# Patient Record
Sex: Female | Born: 1960
Health system: Southern US, Community
[De-identification: ages and names within clinical notes are randomized; demographics above are authoritative.]

## PROBLEM LIST (undated history)

## (undated) DIAGNOSIS — K219 Gastro-esophageal reflux disease without esophagitis: Secondary | ICD-10-CM

## (undated) DIAGNOSIS — M069 Rheumatoid arthritis, unspecified: Secondary | ICD-10-CM

## (undated) DIAGNOSIS — E079 Disorder of thyroid, unspecified: Secondary | ICD-10-CM

## (undated) HISTORY — DX: Gastro-esophageal reflux disease without esophagitis: K21.9

## (undated) HISTORY — DX: Disorder of thyroid, unspecified: E07.9

---

## 1999-05-06 ENCOUNTER — Other Ambulatory Visit: Admission: RE | Admit: 1999-05-06 | Discharge: 1999-05-06 | Payer: Self-pay | Admitting: Internal Medicine

## 2001-04-29 ENCOUNTER — Other Ambulatory Visit: Admission: RE | Admit: 2001-04-29 | Discharge: 2001-04-29 | Payer: Self-pay | Admitting: Internal Medicine

## 2004-09-18 ENCOUNTER — Ambulatory Visit: Payer: Self-pay | Admitting: Internal Medicine

## 2004-12-30 ENCOUNTER — Ambulatory Visit: Payer: Self-pay | Admitting: Internal Medicine

## 2005-04-03 ENCOUNTER — Emergency Department (HOSPITAL_COMMUNITY): Admission: EM | Admit: 2005-04-03 | Discharge: 2005-04-04 | Payer: Self-pay | Admitting: Emergency Medicine

## 2005-10-12 ENCOUNTER — Ambulatory Visit: Payer: Self-pay | Admitting: Internal Medicine

## 2006-05-13 ENCOUNTER — Ambulatory Visit: Payer: Self-pay | Admitting: Internal Medicine

## 2007-01-20 ENCOUNTER — Ambulatory Visit: Payer: Self-pay | Admitting: Internal Medicine

## 2007-01-20 DIAGNOSIS — E039 Hypothyroidism, unspecified: Secondary | ICD-10-CM | POA: Insufficient documentation

## 2007-01-20 DIAGNOSIS — K219 Gastro-esophageal reflux disease without esophagitis: Secondary | ICD-10-CM | POA: Insufficient documentation

## 2007-01-20 LAB — CONVERTED CEMR LAB: TSH: 3.71 microintl units/mL (ref 0.35–5.50)

## 2007-01-28 ENCOUNTER — Telehealth: Payer: Self-pay | Admitting: Internal Medicine

## 2008-01-12 ENCOUNTER — Ambulatory Visit: Payer: Self-pay | Admitting: Internal Medicine

## 2008-01-12 LAB — CONVERTED CEMR LAB: TSH: 3.58 microintl units/mL (ref 0.35–5.50)

## 2009-01-22 ENCOUNTER — Ambulatory Visit: Payer: Self-pay | Admitting: Internal Medicine

## 2009-01-22 DIAGNOSIS — R5381 Other malaise: Secondary | ICD-10-CM | POA: Insufficient documentation

## 2009-01-22 DIAGNOSIS — R5383 Other fatigue: Secondary | ICD-10-CM | POA: Insufficient documentation

## 2009-01-22 LAB — CONVERTED CEMR LAB
Albumin: 3.9 g/dL (ref 3.5–5.2)
BUN: 11 mg/dL (ref 6–23)
Basophils Absolute: 0 10*3/uL (ref 0.0–0.1)
CO2: 29 meq/L (ref 19–32)
Chloride: 106 meq/L (ref 96–112)
Eosinophils Absolute: 0.1 10*3/uL (ref 0.0–0.7)
Glucose, Bld: 61 mg/dL — ABNORMAL LOW (ref 70–99)
HCT: 39.9 % (ref 36.0–46.0)
Lymphocytes Relative: 34.4 % (ref 12.0–46.0)
MCHC: 34.1 g/dL (ref 30.0–36.0)
Platelets: 176 10*3/uL (ref 150.0–400.0)
Potassium: 4.2 meq/L (ref 3.5–5.1)
RDW: 12.4 % (ref 11.5–14.6)
Total Bilirubin: 1 mg/dL (ref 0.3–1.2)
Total Protein: 7.2 g/dL (ref 6.0–8.3)
WBC: 5.2 10*3/uL (ref 4.5–10.5)

## 2012-06-17 ENCOUNTER — Encounter: Payer: Self-pay | Admitting: Internal Medicine

## 2012-06-17 ENCOUNTER — Ambulatory Visit (INDEPENDENT_AMBULATORY_CARE_PROVIDER_SITE_OTHER): Payer: Self-pay | Admitting: Internal Medicine

## 2012-06-17 VITALS — BP 120/80 | HR 65 | Temp 97.9°F | Resp 18 | Ht 61.5 in | Wt 195.0 lb

## 2012-06-17 DIAGNOSIS — E039 Hypothyroidism, unspecified: Secondary | ICD-10-CM

## 2012-06-17 MED ORDER — LEVOTHYROXINE SODIUM 75 MCG PO TABS: 75.0000 ug | ORAL_TABLET | Freq: Every day | ORAL | Status: DC

## 2012-06-17 NOTE — Patient Instructions (Signed)
It is important that you exercise regularly, at least 20 minutes 3 to 4 times per week.  If you develop chest pain or shortness of breath seek  medical attention.   

## 2012-06-17 NOTE — Progress Notes (Signed)
  Subjective:    Patient ID: Julie Perry, female    DOB: [birth year], 52 y.o.   MRN: [id number]  HPI   52 year old patient who is seen today to reestablish with our practice. She has a history of hypothyroidism and has recently relocated back to the [place] area.  She had been no residing on the [facility] medical regimen includes Synthroid 75 mcg only. This has been a stable dose for some time  Past medical history is fairly unremarkable she has had remote tonsillectomy 1967. No other chronic medications.  Family history details of  father's health unknown Mother age 69 history is pertinent pacemaker chronic anticoagulation diabetes 4 sisters one with hypothyroidism.  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  HENT: Negative for hearing loss, ear pain, nosebleeds, congestion, sore throat, mouth sores, trouble swallowing, neck stiffness, dental problem, voice change, sinus pressure and tinnitus.   Eyes: Negative for photophobia, pain, redness and visual disturbance.  Respiratory: Negative for cough, chest tightness and shortness of breath.   Cardiovascular: Negative for chest pain, palpitations and leg swelling.  Gastrointestinal: Negative for nausea, vomiting, abdominal pain, diarrhea, constipation, blood in stool, abdominal distention and rectal pain.  Genitourinary: Negative for dysuria, urgency, frequency, hematuria, flank pain, vaginal bleeding, vaginal discharge, difficulty urinating, genital sores, vaginal pain, menstrual  problem and pelvic pain.  Musculoskeletal: Negative for back pain and arthralgias.  Skin: Negative for rash.  Neurological: Negative for dizziness, syncope, speech difficulty, weakness, light-headedness, numbness and headaches.  Hematological: Negative for adenopathy. Does not bruise/bleed easily.  Psychiatric/Behavioral: Negative for suicidal ideas, behavioral problems, self-injury, dysphoric mood and agitation. The patient is not nervous/anxious.        Objective:   Physical Exam  Constitutional: She is oriented to person, place, and time. She appears well-developed and well-nourished.  HENT:  Head: Normocephalic and atraumatic.  Right Ear: External ear normal.  Left Ear: External ear normal.  Mouth/Throat: Oropharynx is clear and moist.  Eyes: Conjunctivae and EOM are normal.  Neck: Normal range of motion. Neck supple. No JVD present. No thyromegaly present.  Cardiovascular: Normal rate, regular rhythm, normal heart sounds and intact distal pulses.   No murmur heard. Pulmonary/Chest: Effort normal and breath sounds normal. She has no wheezes. She has no rales.  Abdominal: Soft. Bowel sounds are normal. She exhibits no distension and no mass. There is no tenderness. There is no rebound and no guarding.  Musculoskeletal: Normal range of motion. She exhibits no edema and no tenderness.  Neurological: She is alert and oriented to person, place, and time. She has normal reflexes. No cranial nerve deficit. She exhibits normal muscle tone. Coordination normal.  Skin: Skin is warm and dry. No rash noted.  Psychiatric: She has a normal mood and affect. Her behavior is normal.          Assessment & Plan:   Hypothyroidism. Medications refilled. Presently patient has no health insurance we'll check a TSH once this becomes available. She has been on a stable dose for quite some time and clinically is euthyroid

## 2013-06-20 ENCOUNTER — Ambulatory Visit: Payer: Self-pay | Admitting: Internal Medicine

## 2013-06-22 ENCOUNTER — Ambulatory Visit (INDEPENDENT_AMBULATORY_CARE_PROVIDER_SITE_OTHER): Payer: 59 | Admitting: Internal Medicine

## 2013-06-22 ENCOUNTER — Ambulatory Visit: Payer: Self-pay | Admitting: Internal Medicine

## 2013-06-22 ENCOUNTER — Telehealth: Payer: Self-pay | Admitting: Internal Medicine

## 2013-06-22 ENCOUNTER — Encounter: Payer: Self-pay | Admitting: Internal Medicine

## 2013-06-22 VITALS — BP 110/76 | HR 74 | Temp 98.4°F | Resp 20 | Ht 61.5 in | Wt 196.0 lb

## 2013-06-22 DIAGNOSIS — E6609 Other obesity due to excess calories: Secondary | ICD-10-CM

## 2013-06-22 DIAGNOSIS — E039 Hypothyroidism, unspecified: Secondary | ICD-10-CM

## 2013-06-22 DIAGNOSIS — M25559 Pain in unspecified hip: Secondary | ICD-10-CM

## 2013-06-22 DIAGNOSIS — M25569 Pain in unspecified knee: Secondary | ICD-10-CM

## 2013-06-22 DIAGNOSIS — K219 Gastro-esophageal reflux disease without esophagitis: Secondary | ICD-10-CM

## 2013-06-22 DIAGNOSIS — E669 Obesity, unspecified: Secondary | ICD-10-CM | POA: Insufficient documentation

## 2013-06-22 DIAGNOSIS — Z6838 Body mass index (BMI) 38.0-38.9, adult: Secondary | ICD-10-CM | POA: Insufficient documentation

## 2013-06-22 DIAGNOSIS — Z0000 Encounter for general adult medical examination without abnormal findings: Secondary | ICD-10-CM

## 2013-06-22 DIAGNOSIS — IMO0001 Reserved for inherently not codable concepts without codable children: Secondary | ICD-10-CM

## 2013-06-22 LAB — COMPREHENSIVE METABOLIC PANEL
ALK PHOS: 77 U/L (ref 39–117)
ALT: 20 U/L (ref 0–35)
AST: 18 U/L (ref 0–37)
Albumin: 4 g/dL (ref 3.5–5.2)
BILIRUBIN TOTAL: 0.4 mg/dL (ref 0.3–1.2)
BUN: 14 mg/dL (ref 6–23)
CHLORIDE: 103 meq/L (ref 96–112)
CO2: 29 meq/L (ref 19–32)
Calcium: 8.8 mg/dL (ref 8.4–10.5)
Creatinine, Ser: 0.7 mg/dL (ref 0.4–1.2)
GFR: 88.03 mL/min (ref >60.00)
GLUCOSE: 83 mg/dL (ref 70–99)
Potassium: 3.6 mEq/L (ref 3.5–5.1)
Sodium: 137 mEq/L (ref 135–145)
Total Protein: 7.4 g/dL (ref 6.0–8.3)

## 2013-06-22 LAB — CBC WITH DIFFERENTIAL/PLATELET
BASOS ABS: 0 10*3/uL (ref 0.0–0.1)
Basophils Relative: 0.6 % (ref 0.0–3.0)
Eosinophils Absolute: 0.3 10*3/uL (ref 0.0–0.7)
Eosinophils Relative: 3.6 % (ref 0.0–5.0)
HEMATOCRIT: 36.8 % (ref 36.0–46.0)
HEMOGLOBIN: 12.1 g/dL (ref 12.0–15.0)
Lymphocytes Relative: 32.9 % (ref 12.0–46.0)
Lymphs Abs: 2.5 10*3/uL (ref 0.7–4.0)
MCHC: 33 g/dL (ref 30.0–36.0)
MCV: 89.5 fl (ref 78.0–100.0)
Monocytes Absolute: 0.4 10*3/uL (ref 0.1–1.0)
Monocytes Relative: 5.6 % (ref 3.0–12.0)
Neutro Abs: 4.3 10*3/uL (ref 1.4–7.7)
Neutrophils Relative %: 57.3 % (ref 43.0–77.0)
PLATELETS: 203 10*3/uL (ref 150.0–400.0)
RBC: 4.11 Mil/uL (ref 3.87–5.11)
RDW: 13.6 % (ref 11.5–14.6)
WBC: 7.6 10*3/uL (ref 4.5–10.5)

## 2013-06-22 LAB — LIPID PANEL
Cholesterol: 176 mg/dL (ref 0–200)
HDL: 55.8 mg/dL (ref >39.00)
LDL Cholesterol: 96 mg/dL (ref 0–99)
Total CHOL/HDL Ratio: 3
Triglycerides: 122 mg/dL (ref 0.0–149.0)
VLDL: 24.4 mg/dL (ref 0.0–40.0)

## 2013-06-22 LAB — TSH: TSH: 1.24 u[IU]/mL (ref 0.35–5.50)

## 2013-06-22 LAB — SEDIMENTATION RATE: SED RATE: 29 mm/h — AB (ref 0–22)

## 2013-06-22 NOTE — Progress Notes (Signed)
Subjective:    Patient ID: Julie Perry, female    DOB: 21-Feb-1961, 53 y.o.   MRN: [id number]  HPI  53 year old patient who is seen today for an annual exam.  She has a long history of treated hypothyroidism. Complaints today include right hip pain of greater than one year in duration.  She does work at [employer] and spends much of her day on concrete hard surfaces.  She also describes some low back pain.  For the past several months.  She also describes a arthralgias involving the small joints of the hands, elbows, shoulders.  She wakes up in the morning feeling well and denies any early morning pain or stiffness.  Symptoms seemed to worsen throughout the day.  There has been some weight gain.   Past medical history is fairly unremarkable she has had remote tonsillectomy 1967. No other chronic medications.   Family history details of father's health unknown  Mother age 55 history is pertinent pacemaker chronic anticoagulation diabetes  4 sisters one with hypothyroidism.   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  HENT: Negative for congestion, dental problem, hearing loss, rhinorrhea, sinus pressure, sore throat and tinnitus.   Eyes: Negative for pain, discharge and visual disturbance.  Respiratory: Negative for cough and shortness of breath.   Cardiovascular: Negative for chest pain, palpitations and leg swelling.  Gastrointestinal: Negative for nausea, vomiting, abdominal pain, diarrhea, constipation, blood in stool and abdominal distention.    Genitourinary: Negative for dysuria, urgency, frequency, hematuria, flank pain, vaginal bleeding, vaginal discharge, difficulty urinating, vaginal pain and pelvic pain.  Musculoskeletal: Positive for arthralgias and back pain. Negative for gait problem and joint swelling.  Skin: Negative for rash.  Neurological: Negative for dizziness, syncope, speech difficulty, weakness, numbness and headaches.  Hematological: Negative for adenopathy.  Psychiatric/Behavioral: Negative for behavioral problems, dysphoric mood and agitation. The patient is not nervous/anxious.        Objective:   Physical Exam  Constitutional: She is oriented to person, place, and time. She appears well-developed and well-nourished.  HENT:  Head: Normocephalic and atraumatic.  Right Ear: External ear normal.  Left Ear: External ear normal.  Mouth/Throat: Oropharynx is clear and moist.  Eyes: Conjunctivae and EOM are normal.  Neck: Normal range of motion. Neck supple. No JVD present. No thyromegaly present.  Cardiovascular: Normal rate, regular rhythm, normal heart sounds and intact distal pulses.   No murmur heard. Pulmonary/Chest: Effort normal and breath sounds normal. She has no wheezes. She has no rales.  Abdominal: Soft. Bowel sounds are normal. She exhibits no distension and no mass. There is no tenderness. There is no rebound and no guarding.  Genitourinary: Vagina normal.  Musculoskeletal: Normal range of motion. She exhibits no edema and no tenderness.  Very early osteoarthritic changes involving the small joints of the hands, but no active synovitis Range of motion of the right hip appeared to be intact No tenderness over the greater trochanter  Neurological: She is alert and oriented to person, place, and time. She has normal reflexes. No cranial  nerve deficit. She exhibits normal muscle tone. Coordination normal.  Skin: Skin is warm and dry. No rash noted.  Psychiatric: She has a normal mood and affect. Her  behavior is normal.          Assessment & Plan:   Preventive health examination.  The patient would like to see a female physician for her pelvic and Pap.  Will set up with another provider.  The patient was encouraged to transfer her care to Dr. Lessie Dings check screening lab including TSH.  We'll set up for a screening colonoscopy.  Mammogram encouraged Right hip pain.  Probable bursitis.  Will try anti-inflammatory trial.  We'll check a sedimentation rate Arthralgias.  No early morning stiffness.  Doubt inflammatory arthritis Low back pain.  We'll try anti-inflammatories, weight loss encouraged

## 2013-06-22 NOTE — Progress Notes (Signed)
Pre-visit discussion using our clinic review tool. No additional management support is needed unless otherwise documented below in the visit note.  

## 2013-06-22 NOTE — Progress Notes (Deleted)
  Subjective:    Patient ID: Julie Perry, female    DOB: [birth year], 53 y.o.   MRN: [id number]  HPI    53 year old patient who is seen today to reestablish with our practice. She has a history of hypothyroidism and has recently relocated back to the [place] area.  [facility] includes Synthroid 75 mcg only. This has been a stable dose for some time  Past medical history is fairly unremarkable she has had remote tonsillectomy 1967. No other chronic medications.  Family history details of  father's health unknown Mother age 55 history is pertinent pacemaker chronic anticoagulation diabetes 4 sisters one with hypothyroidism.  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  HENT: Negative for congestion, dental problem, ear pain, hearing loss, mouth sores, nosebleeds, sinus pressure, sore throat, tinnitus, trouble swallowing and voice change.   Eyes: Negative for photophobia, pain, redness and visual disturbance.  Respiratory: Negative for cough, chest tightness and shortness of breath.   Cardiovascular: Negative for chest pain, palpitations and leg swelling.  Gastrointestinal: Negative for nausea, vomiting, abdominal pain, diarrhea, constipation, blood in stool, abdominal distention and rectal pain.  Genitourinary: Negative for dysuria, urgency, frequency, hematuria, flank pain, vaginal bleeding, vaginal discharge, difficulty urinating, genital sores, vaginal pain, menstrual problem and  pelvic pain.  Musculoskeletal: Negative for arthralgias, back pain and neck stiffness.  Skin: Negative for rash.  Neurological: Negative for dizziness, syncope, speech difficulty, weakness, light-headedness, numbness and headaches.  Hematological: Negative for adenopathy. Does not bruise/bleed easily.  Psychiatric/Behavioral: Negative for suicidal ideas, behavioral problems, self-injury, dysphoric mood and agitation. The patient is not nervous/anxious.        Objective:   Physical Exam  Constitutional: She is oriented to person, place, and time. She appears well-developed and well-nourished.  HENT:  Head: Normocephalic and atraumatic.  Right Ear: External ear normal.  Left Ear: External ear normal.  Mouth/Throat: Oropharynx is clear and moist.  Eyes: Conjunctivae and EOM are normal.  Neck: Normal range of motion. Neck supple. No JVD present. No thyromegaly present.  Cardiovascular: Normal rate, regular rhythm, normal heart sounds and intact distal pulses.   No murmur heard. Pulmonary/Chest: Effort normal and breath sounds normal. She has no wheezes. She has no rales.  Abdominal: Soft. Bowel sounds are normal. She exhibits no distension and no mass. There is no tenderness. There is no rebound and no guarding.  Musculoskeletal: Normal range of motion. She exhibits no edema and no tenderness.  Neurological: She is alert and oriented to person, place, and time. She has normal reflexes. No cranial nerve deficit. She exhibits normal muscle tone. Coordination normal.  Skin: Skin is warm and dry. No rash noted.  Psychiatric: She has a normal mood and affect. Her behavior is normal.          Assessment & Plan:   Hypothyroidism. Medications refilled. Presently patient has no health insurance we'll check a TSH once this becomes available. She has been on a stable dose for quite some time and clinically is euthyroid

## 2013-06-22 NOTE — Patient Instructions (Signed)
It is important that you exercise regularly, at least 20 minutes 3 to 4 times per week.  If you develop chest pain or shortness of breath seek  medical attention.   Take Aleve 200 mg twice daily for pain or swelling  You need to lose weight.  Consider a lower calorie diet and regular exercise.  Schedule your colonoscopy to help detect colon cancer.  Schedule your mammogram.Back Injury Prevention Back injuries can be extremely painful and difficult to heal. After having one back injury, you are much more likely to experience another later on. It is important to learn how to avoid injuring or re-injuring your back. The following tips can help you to prevent a back injury. PHYSICAL FITNESS  Exercise regularly and try to develop good tone in your abdominal muscles. Your abdominal muscles provide a lot of the support needed by your back.  Do aerobic exercises (walking, jogging, biking, swimming) regularly.  Do exercises that increase balance and strength (tai chi, yoga) regularly. This can decrease your risk of falling and injuring your back.  Stretch before and after exercising.  Maintain a healthy weight. The more you weigh, the more stress is placed on your back. For every pound of weight, 10 times that amount of pressure is placed on the back. DIET  Talk to your caregiver about how much calcium and vitamin D you need per day. These nutrients help to prevent weakening of the bones (osteoporosis). Osteoporosis can cause broken (fractured) bones that lead to back pain.  Include good sources of calcium in your diet, such as dairy products, green, leafy vegetables, and products with calcium added (fortified).  Include good sources of vitamin D in your diet, such as milk and foods that are fortified with vitamin D.  Consider taking a nutritional supplement or a multivitamin if needed.  Stop smoking if you smoke. POSTURE  Sit and stand up straight. Avoid leaning forward when you sit or  hunching over when you stand.  Choose chairs with good low back (lumbar) support.  If you work at a desk, sit close to your work so you do not need to lean over. Keep your chin tucked in. Keep your neck drawn back and elbows bent at a right angle. Your arms should look like the letter "L."  Sit high and close to the steering wheel when you drive. Add a lumbar support to your car seat if needed.  Avoid sitting or standing in one position for too long. Take breaks to get up, stretch, and walk around at least once every hour. Take breaks if you are driving for long periods of time.  Sleep on your side with your knees slightly bent, or sleep on your back with a pillow under your knees. Do not sleep on your stomach. LIFTING, TWISTING, AND REACHING  Avoid heavy lifting, especially repetitive lifting. If you must do heavy lifting:  Stretch before lifting.  Work slowly.  Rest between lifts.  Use carts and dollies to move objects when possible.  Make several small trips instead of carrying 1 heavy load.  Ask for help when you need it.  Ask for help when moving big, awkward objects.  Follow these steps when lifting:  Stand with your feet shoulder-width apart.  Get as close to the object as you can. Do not try to pick up heavy objects that are far from your body.  Use handles or lifting straps if they are available.  Bend at your knees. Squat down, but keep  your heels off the floor.  Keep your shoulders pulled back, your chin tucked in, and your back straight.  Lift the object slowly, tightening the muscles in your legs, abdomen, and buttocks. Keep the object as close to the center of your body as possible.  When you put a load down, use these same guidelines in reverse.  Do not:  Lift the object above your waist.  Twist at the waist while lifting or carrying a load. Move your feet if you need to turn, not your waist.  Bend over without bending at your knees.  Avoid reaching  over your head, across a table, or for an object on a high surface. OTHER TIPS  Avoid wet floors and keep sidewalks clear of ice to prevent falls.  Do not sleep on a mattress that is too soft or too hard.  Keep items that are used frequently within easy reach.  Put heavier objects on shelves at waist level and lighter objects on lower or higher shelves.  Find ways to decrease your stress, such as exercise, massage, or relaxation techniques. Stress can build up in your muscles. Tense muscles are more vulnerable to injury.  Seek treatment for depression or anxiety if needed. These conditions can increase your risk of developing back pain. SEEK MEDICAL CARE IF:  You injure your back.  You have questions about diet, exercise, or other ways to prevent back injuries. MAKE SURE YOU:  Understand these instructions.  Will watch your condition.  Will get help right away if you are not doing well or get worse. Document Released: 04/23/2004 Document Revised: 06/08/2011 Document Reviewed: 04/27/2011 [facility] Patient Information 2014 [place], [place]. Chronic Back Pain  When back pain lasts longer than 3 months, it is called chronic back pain.People with chronic back pain often go through certain periods that are more intense (flare-ups).  CAUSES Chronic back pain can be caused by wear and tear (degeneration) on different structures in your back. These structures include:  The bones of your spine (vertebrae) and the joints surrounding your spinal cord and nerve roots (facets).  The strong, fibrous tissues that connect your vertebrae (ligaments). Degeneration of these structures may result in pressure on your nerves. This can lead to constant pain. HOME CARE INSTRUCTIONS  Avoid bending, heavy lifting, prolonged sitting, and activities which make the problem worse.  Take brief periods of rest throughout the day to reduce your pain. Lying down or standing usually is better than sitting while  you are resting.  Take over-the-counter or prescription medicines only as directed by your caregiver. SEEK IMMEDIATE MEDICAL CARE IF:   You have weakness or numbness in one of your legs or feet.  You have trouble controlling your bladder or bowels.  You have nausea, vomiting, abdominal pain, shortness of breath, or fainting. Document Released: 04/23/2004 Document Revised: 06/08/2011 Document Reviewed: 02/28/2011 [facility] Patient Information 2014 [place], [place].

## 2013-06-22 NOTE — Telephone Encounter (Signed)
Pt seen today and needs refill for levothyroxine (SYNTHROID, LEVOTHROID) 75 MCG tablet 90 day w/ refills (1 /dy) [facility] [address] Pt doesn't need today, has enough for 2 wks.

## 2013-06-24 MED ORDER — LEVOTHYROXINE SODIUM 75 MCG PO TABS: 75.0000 ug | ORAL_TABLET | Freq: Every day | ORAL | Status: DC

## 2013-06-24 NOTE — Telephone Encounter (Signed)
Rx sent 

## 2013-09-22 ENCOUNTER — Ambulatory Visit: Payer: 59 | Admitting: Family Medicine

## 2013-11-21 ENCOUNTER — Other Ambulatory Visit (HOSPITAL_COMMUNITY)
Admission: RE | Admit: 2013-11-21 | Discharge: 2013-11-21 | Disposition: A | Discharge status: Home / Self Care | Payer: 59 | Source: Ambulatory Visit | Attending: Family Medicine | Admitting: Family Medicine

## 2013-11-21 ENCOUNTER — Ambulatory Visit (INDEPENDENT_AMBULATORY_CARE_PROVIDER_SITE_OTHER): Payer: 59 | Admitting: Family Medicine

## 2013-11-21 ENCOUNTER — Encounter: Payer: Self-pay | Admitting: Family Medicine

## 2013-11-21 VITALS — BP 104/72 | HR 60 | Temp 97.9°F | Ht 62.0 in | Wt 202.0 lb

## 2013-11-21 DIAGNOSIS — Z01419 Encounter for gynecological examination (general) (routine) without abnormal findings: Secondary | ICD-10-CM | POA: Diagnosis not present

## 2013-11-21 DIAGNOSIS — Z1151 Encounter for screening for human papillomavirus (HPV): Secondary | ICD-10-CM | POA: Diagnosis present

## 2013-11-21 DIAGNOSIS — E039 Hypothyroidism, unspecified: Secondary | ICD-10-CM

## 2013-11-21 DIAGNOSIS — I872 Venous insufficiency (chronic) (peripheral): Secondary | ICD-10-CM

## 2013-11-21 DIAGNOSIS — Z0000 Encounter for general adult medical examination without abnormal findings: Secondary | ICD-10-CM

## 2013-11-21 DIAGNOSIS — M722 Plantar fascial fibromatosis: Secondary | ICD-10-CM

## 2013-11-21 LAB — TSH: TSH: 1.64 u[IU]/mL (ref 0.35–4.50)

## 2013-11-21 NOTE — Progress Notes (Signed)
Pre visit review using our clinic review tool, if applicable. No additional management support is needed unless otherwise documented below in the visit note. 

## 2013-11-21 NOTE — Addendum Note (Signed)
Addended by: Johnella Moloney on: 11/21/2013 09:20 AM   Modules accepted: Orders

## 2013-11-21 NOTE — Patient Instructions (Addendum)
-  schedule your mammogram  -Vitamin D3 1000 IU daily; Calcium  -complete the stool cards as soon as possible  -We have ordered labs or studies at this visit. It can take up to 1-2 weeks for results and processing. We will contact you with instructions IF your results are abnormal. Normal results will be released to your [facility]. If you have not heard from [place] or can not find your results in [facility] in 2 weeks please contact our office.  We recommend the following healthy lifestyle measures: - eat a healthy diet consisting of lots of vegetables, fruits, beans, nuts, seeds, healthy meats such as white chicken and fish and whole grains.  - avoid fried foods, fast food, processed foods, sodas, red meet and other fattening foods.  - get a least 150 minutes of aerobic exercise per week.   -compression stockings at work  -[place] sock for foot pain  -follow up in 1-2 months

## 2013-11-21 NOTE — Progress Notes (Signed)
No chief complaint on file.   HPI:  Here for CPE:  -Concerns and/or follow up today:   Note: Julie Perry is a pleasant 53 yo patient of Dr. Amador Cunas whom wanted a female doctor to do her physical - per notes she was to transfer care to me.   Hypothyroidism: -stable: last TSH 05/2013 -denies: heat/cold intolerance, palpitations, skin changes  Obesity: -Diet: variety of foods, balance and well rounded, larger portion sizes -Exercise: no regular exercise  L heel pain: -started about 1month ago -with first steps in morning -no injury or hx of this pain, no weakness or numness  Chronic LE swelling when on concrete all day  -Taking folic acid, vitamin D or calcium: no  -Diabetes and Dyslipidemia Screening: done in last 1 year  -Hx of HTN: no  -Vaccines: UTD  -pap history: thinks was several years ago, no abnormal pap smears per her report  -FDLMP: N/A  -sexual activity: yes,  no new partners - no sex in 3 years  -wants STI testing: declined  -FH breast, colon or ovarian ca: see FH Last mammogram: several years ago Last colon cancer screening: not done, she does not want to do the colonoscopy - agrees to do the stool cards  Breast Ca Risk Assessment: -[email or web address] 5 year risk 1.3  -Alcohol, Tobacco, drug use: see social history  Review of Systems - no fevers, unintentional weight loss, vision loss, hearing loss, chest pain, sob, hemoptysis, melena, hematochezia, hematuria, genital discharge, changing or concerning skin lesions, bleeding, bruising, loc, thoughts of self harm or SI  Past Medical History  Diagnosis Date  . Thyroid disease   . GERD (gastroesophageal reflux disease)     History reviewed. No pertinent past surgical history.  No family history on file.  History   Social History  . Marital Status: Married    Spouse Name: N/A    Number of Children: N/A  . Years of Education: N/A   Social History Main Topics  . Smoking  status: Former Smoker    Types: Cigarettes  . Smokeless tobacco: Never Used  . Alcohol Use: Yes     Comment: Rarely once a year  . Drug Use: No  . Sexual Activity: None   Other Topics Concern  . None   Social History Narrative   Work or School: Engineer, technical sales Situation: lives with son (18 yo)      Spiritual Beliefs: Christian      Lifestyle: no regular exercise, diet is so so             Current outpatient prescriptions:levothyroxine (SYNTHROID, LEVOTHROID) 75 MCG tablet, Take 1 tablet (75 mcg total) by mouth daily., Disp: 90 tablet, Rfl: 3  EXAM:  Filed Vitals:   11/21/13 0827  BP: 104/72  Pulse: 60  Temp: 97.9 F (36.6 C)    GENERAL: vitals reviewed and listed below, alert, oriented, appears well hydrated and in no acute distress  HEENT: head atraumatic, PERRLA, normal appearance of eyes, ears, nose and mouth. moist mucus membranes.  NECK: supple, no masses or lymphadenopathy  LUNGS: clear to auscultation bilaterally, no rales, rhonchi or wheeze  CV: HRRR, no peripheral edema or cyanosis, normal pedal pulses  BREAST: normal appearance - no lesions or discharge, on palpation normal breast tissue without any suspicious masses  ABDOMEN: bowel sounds normal, soft, non tender to palpation, no masses, no rebound or guarding  GU: normal appearance of external genitalia -  no lesions or masses, normal vaginal mucosa - no abnormal discharge, normal appearance of cervix - no lesions or abnormal discharge, no masses or tenderness on palpation of uterus and ovaries.  RECTAL: refused  SKIN: no rash or abnormal lesions  MS: normal gait, moves all extremities normally, TTP plantar fascial attach L foot, muscle strength and sensation in tact  NEURO: CN II-XII grossly intact, normal muscle strength and sensation to light touch on extremities  PSYCH: normal affect, pleasant and cooperative  ASSESSMENT AND PLAN:  Discussed the  following assessment and plan:  There are no diagnoses linked to this encounter.  -Discussed and advised all [place] preventive services health task force level A and B recommendations for age, sex and risks.  -Advised at least 150 minutes of exercise per week and a healthy diet low in saturated fats and sweets and consisting of fresh fruits and vegetables, lean meats such as fish and white chicken and whole grains.  -tdap    -labs, studies and vaccines per orders this encounter  Orders Placed This Encounter  Procedures  . TSH    Patient advised to return to clinic immediately if symptoms worsen or persist or new concerns.  Patient Instructions  -schedule your mammogram  -Vitamin D3 1000 IU daily; Calcium  -complete the stool cards as soon as possible  -We have ordered labs or studies at this visit. It can take up to 1-2 weeks for results and processing. We will contact you with instructions IF your results are abnormal. Normal results will be released to your [facility]. If you have not heard from [place] or can not find your results in [facility] in 2 weeks please contact our office.  We recommend the following healthy lifestyle measures: - eat a healthy diet consisting of lots of vegetables, fruits, beans, nuts, seeds, healthy meats such as white chicken and fish and whole grains.  - avoid fried foods, fast food, processed foods, sodas, red meet and other fattening foods.  - get a least 150 minutes of aerobic exercise per week.   -compression stockings at work  -[employer] for foot pain  -follow up in 6 months    Return in about 1 month (around 12/22/2013) for plantar fasciitis.  Kriste Basque R.

## 2013-11-22 LAB — CYTOLOGY - PAP

## 2013-11-30 ENCOUNTER — Encounter: Payer: Self-pay | Admitting: *Deleted

## 2013-12-27 ENCOUNTER — Ambulatory Visit: Payer: 59 | Admitting: Internal Medicine

## 2014-01-11 ENCOUNTER — Ambulatory Visit: Payer: 59 | Admitting: Family Medicine

## 2014-01-12 ENCOUNTER — Encounter: Payer: 59 | Admitting: Family Medicine

## 2014-01-12 NOTE — Progress Notes (Signed)
Error   This encounter was created in error - please disregard. 

## 2014-05-04 ENCOUNTER — Encounter (HOSPITAL_COMMUNITY): Payer: Self-pay

## 2014-05-04 ENCOUNTER — Emergency Department (HOSPITAL_COMMUNITY)
Admission: EM | Admit: 2014-05-04 | Discharge: 2014-05-04 | Disposition: A | Discharge status: Home / Self Care | Payer: 59 | Attending: Emergency Medicine | Admitting: Emergency Medicine

## 2014-05-04 DIAGNOSIS — Z8719 Personal history of other diseases of the digestive system: Secondary | ICD-10-CM | POA: Insufficient documentation

## 2014-05-04 DIAGNOSIS — M62830 Muscle spasm of back: Secondary | ICD-10-CM | POA: Insufficient documentation

## 2014-05-04 DIAGNOSIS — M5432 Sciatica, left side: Secondary | ICD-10-CM | POA: Insufficient documentation

## 2014-05-04 DIAGNOSIS — Z87891 Personal history of nicotine dependence: Secondary | ICD-10-CM | POA: Insufficient documentation

## 2014-05-04 DIAGNOSIS — Z79899 Other long term (current) drug therapy: Secondary | ICD-10-CM | POA: Diagnosis not present

## 2014-05-04 DIAGNOSIS — M62838 Other muscle spasm: Secondary | ICD-10-CM

## 2014-05-04 DIAGNOSIS — M545 Low back pain: Secondary | ICD-10-CM | POA: Diagnosis present

## 2014-05-04 DIAGNOSIS — M549 Dorsalgia, unspecified: Secondary | ICD-10-CM

## 2014-05-04 DIAGNOSIS — E079 Disorder of thyroid, unspecified: Secondary | ICD-10-CM | POA: Diagnosis not present

## 2014-05-04 MED ORDER — HYDROCODONE-ACETAMINOPHEN 5-325 MG PO TABS: 1.0000 | ORAL_TABLET | Freq: Four times a day (QID) | ORAL | Status: DC | PRN

## 2014-05-04 MED ORDER — DIAZEPAM 5 MG PO TABS: 5.0000 mg | ORAL_TABLET | Freq: Three times a day (TID) | ORAL | Status: DC | PRN

## 2014-05-04 NOTE — ED Provider Notes (Signed)
CSN: [id number]     Arrival date & time 05/04/14  1056 History   First MD Initiated Contact with Patient 05/04/14 1105     Chief Complaint  Patient presents with  . Back Pain     (Consider location/radiation/quality/duration/timing/severity/associated sxs/prior Treatment) HPI Comments: Patient presents emergency department with chief complaint of low back pain that started last Sunday. She denies any known mechanism of injury. She states the pain has been progressively worsening. She describes it as low and on the left side. She states the pain radiates to the left leg. She reports a burning and aching sensation to the left leg. Denies any weakness, or numbness. The pain is aggravated with movement. Patient states that she works at [employer], and has to lift for work. She denies any discharge. Denies any bowel or bladder incontinence. Denies any fevers chills. She has not taken anything to alleviate her symptoms.  The history is provided by the patient. No language interpreter was used.    Past Medical History  Diagnosis Date  . Thyroid disease   . GERD (gastroesophageal reflux disease)    History reviewed. No pertinent past surgical history. History reviewed. No pertinent family history. History  Substance Use Topics  . Smoking status: Former Smoker    Types: Cigarettes  . Smokeless tobacco: Never Used  . Alcohol Use: Yes     Comment: Rarely once a year   OB History    No data available     Review of Systems  Constitutional: Negative for fever and chills.  Gastrointestinal:       No bowel incontinence  Genitourinary:       No urinary incontinence  Musculoskeletal: Positive for myalgias, back pain and arthralgias.  Neurological:       No saddle anesthesia      Allergies  Review of patient's allergies indicates no known allergies.  Home Medications   Prior to Admission medications   Medication Sig Start Date End Date Taking? Authorizing Provider  diazepam (VALIUM) 5  MG tablet Take 1 tablet (5 mg total) by mouth every 8 (eight) hours as needed for anxiety. 05/04/14   Roxy Horseman, PA-C  HYDROcodone-acetaminophen (NORCO/VICODIN) 5-325 MG per tablet Take 1-2 tablets by mouth every 6 (six) hours as needed for moderate pain or severe pain. 05/04/14   Roxy Horseman, PA-C  levothyroxine (SYNTHROID, LEVOTHROID) 75 MCG tablet Take 1 tablet (75 mcg total) by mouth daily. 06/24/13   Gordy Savers, MD   BP 126/63 mmHg  Pulse 57  Temp(Src) 98.4 F (36.9 C) (Oral)  Resp 18  SpO2 100%  LMP 08/12/2010 Physical Exam  Constitutional: She is oriented to person, place, and time. She appears well-developed and well-nourished. No distress.  HENT:  Head: Normocephalic and atraumatic.  Eyes: Conjunctivae and EOM are normal. Right eye exhibits no discharge. Left eye exhibits no discharge. No scleral icterus.  Neck: Normal range of motion. Neck supple. No tracheal deviation present.  Cardiovascular: Normal rate, regular rhythm and normal heart sounds.  Exam reveals no gallop and no friction rub.   No murmur heard. Pulmonary/Chest: Effort normal and breath sounds normal. No respiratory distress. She has no wheezes.  Abdominal: Soft. She exhibits no distension. There is no tenderness.  Musculoskeletal: Normal range of motion.  Left lumbar paraspinal muscles tender to palpation, no bony tenderness, step-offs, or gross abnormality or deformity of spine, patient is able to ambulate, moves all extremities  Bilateral great toe extension intact Bilateral plantar/dorsiflexion intact  Neurological: She is  alert and oriented to person, place, and time. She has normal reflexes.  Sensation and strength intact bilaterally Symmetrical reflexes  Skin: Skin is warm. She is not diaphoretic.  Psychiatric: She has a normal mood and affect. Her behavior is normal. Judgment and thought content normal.  Nursing note and vitals reviewed.   ED Course  Procedures (including critical care  time) Labs Review Labs Reviewed - No data to display  Imaging Review No results found.   EKG Interpretation None      MDM   Final diagnoses:  Back pain, unspecified location  Muscle spasm  Sciatica, left    Patient with back pain.  No neurological deficits and normal neuro exam.  Patient is ambulatory.  No loss of bowel or bladder control.  Doubt cauda equina.  Denies fever,  doubt epidural abscess or other lesion. Recommend back exercises, stretching, RICE, and will treat with a short course of norco and valium.  Encouraged the patient that there could be a need for additional workup and/or imaging such as MRI, if the symptoms do not resolve. Patient advised that if the back pain does not resolve, or radiates, this could progress to more serious conditions and is encouraged to follow-up with PCP or orthopedics within 2 weeks.       Roxy Horsemanobert Dakiyah Heinke, PA-C 05/04/14 1138  Doug SouSam Jacubowitz, MD 05/04/14 [phone]

## 2014-05-04 NOTE — Discharge Instructions (Signed)
Muscle Cramps and Spasms °Muscle cramps and spasms occur when a muscle or muscles tighten and you have no control over this tightening (involuntary muscle contraction). They are a common problem and can develop in any muscle. The most common place is in the calf muscles of the leg. Both muscle cramps and muscle spasms are involuntary muscle contractions, but they also have differences:  °· Muscle cramps are sporadic and painful. They may last a few seconds to a quarter of an hour. Muscle cramps are often more forceful and last longer than muscle spasms. °· Muscle spasms may or may not be painful. They may also last just a few seconds or much longer. °CAUSES  °It is uncommon for cramps or spasms to be due to a serious underlying problem. In many cases, the cause of cramps or spasms is unknown. Some common causes are:  °· Overexertion.   °· Overuse from repetitive motions (doing the same thing over and over).   °· Remaining in a certain position for a long period of time.   °· Improper preparation, form, or technique while performing a sport or activity.   °· Dehydration.   °· Injury.   °· Side effects of some medicines.   °· Abnormally low levels of the salts and ions in your blood (electrolytes), especially potassium and calcium. This could happen if you are taking water pills (diuretics) or you are pregnant.   °Some underlying medical problems can make it more likely to develop cramps or spasms. These include, but are not limited to:  °· Diabetes.   °· Parkinson disease.   °· Hormone disorders, such as thyroid problems.   °· Alcohol abuse.   °· Diseases specific to muscles, joints, and bones.   °· Blood vessel disease where not enough blood is getting to the muscles.   °HOME CARE INSTRUCTIONS  °· Stay well hydrated. Drink enough water and fluids to keep your urine clear or pale yellow. °· It may be helpful to massage, stretch, and relax the affected muscle. °· For tight or tense muscles, use a warm towel, heating  pad, or hot shower water directed to the affected area. °· If you are sore or have pain after a cramp or spasm, applying ice to the affected area may relieve discomfort. °¨ Put ice in a plastic bag. °¨ Place a towel between your skin and the bag. °¨ Leave the ice on for 15-20 minutes, 03-04 times a day. °· Medicines used to treat a known cause of cramps or spasms may help reduce their frequency or severity. Only take over-the-counter or prescription medicines as directed by your caregiver. °SEEK MEDICAL CARE IF:  °Your cramps or spasms get more severe, more frequent, or do not improve over time.  °MAKE SURE YOU:  °· Understand these instructions. °· Will watch your condition. °· Will get help right away if you are not doing well or get worse. °Document Released: 09/05/2001 Document Revised: 07/11/2012 Document Reviewed: 03/02/2012 °ExitCare® Patient Information ©2015 [employer]. This information is not intended to replace advice given to you by your health care provider. Make sure you discuss any questions you have with your health care provider. °Back Pain, Adult °Low back pain is very common. About 1 in 5 people have back pain. The cause of low back pain is rarely dangerous. The pain often gets better over time. About half of people with a sudden onset of back pain feel better in just 2 weeks. About 8 in 10 people feel better by 6 weeks.  °CAUSES °Some common causes of back pain include: °· Strain of   the muscles or ligaments supporting the spine. °· Wear and tear (degeneration) of the spinal discs. °· Arthritis. °· Direct injury to the back. °DIAGNOSIS °Most of the time, the direct cause of low back pain is not known. However, back pain can be treated effectively even when the exact cause of the pain is unknown. Answering your caregiver's questions about your overall health and symptoms is one of the most accurate ways to make sure the cause of your pain is not dangerous. If your caregiver needs more  information, he or she may order lab work or imaging tests (X-rays or MRIs). However, even if imaging tests show changes in your back, this usually does not require surgery. °HOME CARE INSTRUCTIONS °For many people, back pain returns. Since low back pain is rarely dangerous, it is often a condition that people can learn to manage on their own.  °· Remain active. It is stressful on the back to sit or stand in one place. Do not sit, drive, or stand in one place for more than 30 minutes at a time. Take short walks on level surfaces as soon as pain allows. Try to increase the length of time you walk each day. °· Do not stay in bed. Resting more than 1 or 2 days can delay your recovery. °· Do not avoid exercise or work. Your body is made to move. It is not dangerous to be active, even though your back may hurt. Your back will likely heal faster if you return to being active before your pain is gone. °· Pay attention to your body when you  bend and lift. Many people have less discomfort when lifting if they bend their knees, keep the load close to their bodies, and avoid twisting. Often, the most comfortable positions are those that put less stress on your recovering back. °· Find a comfortable position to sleep. Use a firm mattress and lie on your side with your knees slightly bent. If you lie on your back, put a pillow under your knees. °· Only take over-the-counter or prescription medicines as directed by your caregiver. Over-the-counter medicines to reduce pain and inflammation are often the most helpful. Your caregiver may prescribe muscle relaxant drugs. These medicines help dull your pain so you can more quickly return to your normal activities and healthy exercise. °· Put ice on the injured area. °· Put ice in a plastic bag. °· Place a towel between your skin and the bag. °· Leave the ice on for 15-20 minutes, 03-04 times a day for the first 2 to 3 days. After that, ice and heat may be alternated to reduce pain  and spasms. °· Ask your caregiver about trying back exercises and gentle massage. This may be of some benefit. °· Avoid feeling anxious or stressed. Stress increases muscle tension and can worsen back pain. It is important to recognize when you are anxious or stressed and learn ways to manage it. Exercise is a great option. °SEEK MEDICAL CARE IF: °· You have pain that is not relieved with rest or medicine. °· You have pain that does not improve in 1 week. °· You have new symptoms. °· You are generally not feeling well. °SEEK IMMEDIATE MEDICAL CARE IF:  °· You have pain that radiates from your back into your legs. °· You develop new bowel or bladder control problems. °· You have unusual weakness or numbness in your arms or legs. °· You develop nausea or vomiting. °· You develop abdominal pain. °· You feel faint. °Document Released: 03/16/2005 Document Revised: 09/15/2011 Document Reviewed: 07/18/2013 °ExitCare® Patient Information ©2015 [employer]. This   information is not intended to replace advice given to you by your health care provider. Make sure you discuss any questions you have with your health care provider. Sciatica Sciatica is pain, weakness, numbness, or tingling along the path of the sciatic nerve. The nerve starts in the lower back and runs down the back of each leg. The nerve controls the muscles in the lower leg and in the back of the knee, while also providing sensation to the back of the thigh, lower leg, and the sole of your foot. Sciatica is a symptom of another medical condition. For instance, nerve damage or certain conditions, such as a herniated disk or bone spur on the spine, pinch or put pressure on the sciatic nerve. This causes the pain, weakness, or other sensations normally associated with sciatica. Generally, sciatica only affects one side of the body. CAUSES   Herniated or slipped disc.  Degenerative disk disease.  A pain disorder involving the narrow muscle in the buttocks  (piriformis syndrome).  Pelvic injury or fracture.  Pregnancy.  Tumor (rare). SYMPTOMS  Symptoms can vary from mild to very severe. The symptoms usually travel from the low back to the buttocks and down the back of the leg. Symptoms can include:  Mild tingling or dull aches in the lower back, leg, or hip.  Numbness in the back of the calf or sole of the foot.  Burning sensations in the lower back, leg, or hip.  Sharp pains in the lower back, leg, or hip.  Leg weakness.  Severe back pain inhibiting movement. These symptoms may get worse with coughing, sneezing, laughing, or prolonged sitting or standing. Also, being overweight may worsen symptoms. DIAGNOSIS  Your caregiver will perform a physical exam to look for common symptoms of sciatica. He or she may ask you to do certain movements or activities that would trigger sciatic nerve pain. Other tests may be performed to find the cause of the sciatica. These may include:  Blood tests.  X-rays.  Imaging tests, such as an MRI or CT scan. TREATMENT  Treatment is directed at the cause of the sciatic pain. Sometimes, treatment is not necessary and the pain and discomfort goes away on its own. If treatment is needed, your caregiver may suggest:  Over-the-counter medicines to relieve pain.  Prescription medicines, such as anti-inflammatory medicine, muscle relaxants, or narcotics.  Applying heat or ice to the painful area.  Steroid injections to lessen pain, irritation, and inflammation around the nerve.  Reducing activity during periods of pain.  Exercising and stretching to strengthen your abdomen and improve flexibility of your spine. Your caregiver may suggest losing weight if the extra weight makes the back pain worse.  Physical therapy.  Surgery to eliminate what is pressing or pinching the nerve, such as a bone spur or part of a herniated disk. HOME CARE INSTRUCTIONS   Only take over-the-counter or prescription  medicines for pain or discomfort as directed by your caregiver.  Apply ice to the affected area for 20 minutes, 3-4 times a day for the first 48-72 hours. Then try heat in the same way.  Exercise, stretch, or perform your usual activities if these do not aggravate your pain.  Attend physical therapy sessions as directed by your caregiver.  Keep all follow-up appointments as directed by your caregiver.  Do not wear high heels or shoes that do not provide proper support.  Check your mattress to see if it is too soft. A firm mattress may lessen your pain  and discomfort. SEEK IMMEDIATE MEDICAL CARE IF:   You lose control of your bowel or bladder (incontinence).  You have increasing weakness in the lower back, pelvis, buttocks, or legs.  You have redness or swelling of your back.  You have a burning sensation when you urinate.  You have pain that gets worse when you lie down or awakens you at night.  Your pain is worse than you have experienced in the past.  Your pain is lasting longer than 4 weeks.  You are suddenly losing weight without reason. MAKE SURE YOU:  Understand these instructions.  Will watch your condition.  Will get help right away if you are not doing well or get worse. Document Released: 03/10/2001 Document Revised: 09/15/2011 Document Reviewed: 07/26/2011 [facility] LPExit[facility] Patient Information 2015 [place], [place]. This information is not intended to replace advice given to you by your health care provider. Make sure you discuss any questions you have with your health care provider.

## 2014-05-04 NOTE — ED Notes (Signed)
Per pt, started having lower back pain on Sunday. Pain has gotten worse.  No change in urination.  No burning with urination.  Pain in back related to position changes.

## 2014-05-08 ENCOUNTER — Encounter: Payer: Self-pay | Admitting: Internal Medicine

## 2014-05-08 ENCOUNTER — Ambulatory Visit (INDEPENDENT_AMBULATORY_CARE_PROVIDER_SITE_OTHER): Payer: 59 | Admitting: Internal Medicine

## 2014-05-08 VITALS — BP 130/86 | HR 65 | Temp 98.2°F | Resp 20 | Ht 62.0 in | Wt 198.0 lb

## 2014-05-08 DIAGNOSIS — E039 Hypothyroidism, unspecified: Secondary | ICD-10-CM

## 2014-05-08 DIAGNOSIS — M5442 Lumbago with sciatica, left side: Secondary | ICD-10-CM

## 2014-05-08 MED ORDER — LORAZEPAM 0.5 MG PO TABS: 0.5000 mg | ORAL_TABLET | Freq: Two times a day (BID) | ORAL | Status: DC | PRN

## 2014-05-08 MED ORDER — OXYCODONE-ACETAMINOPHEN 5-325 MG PO TABS: 1.0000 | ORAL_TABLET | Freq: Four times a day (QID) | ORAL | Status: DC | PRN

## 2014-05-08 MED ORDER — METHYLPREDNISOLONE ACETATE 80 MG/ML IJ SUSP
80.0000 mg | Freq: Once | INTRAMUSCULAR | Status: AC
  Administered 2014-05-08: 80 mg via INTRAMUSCULAR

## 2014-05-08 NOTE — Patient Instructions (Signed)
Back Injury Prevention Back injuries can be extremely painful and difficult to heal. After having one back injury, you are much more likely to experience another later on. It is important to learn how to avoid injuring or re-injuring your back. The following tips can help you to prevent a back injury. PHYSICAL FITNESS  Exercise regularly and try to develop good tone in your abdominal muscles. Your abdominal muscles provide a lot of the support needed by your back.  Do aerobic exercises (walking, jogging, biking, swimming) regularly.  Do exercises that increase balance and strength (tai chi, yoga) regularly. This can decrease your risk of falling and injuring your back.  Stretch before and after exercising.  Maintain a healthy weight. The more you weigh, the more stress is placed on your back. For every pound of weight, 10 times that amount of pressure is placed on the back. DIET  Talk to your caregiver about how much calcium and vitamin D you need per day. These nutrients help to prevent weakening of the bones (osteoporosis). Osteoporosis can cause broken (fractured) bones that lead to back pain.  Include good sources of calcium in your diet, such as dairy products, green, leafy vegetables, and products with calcium added (fortified).  Include good sources of vitamin D in your diet, such as milk and foods that are fortified with vitamin D.  Consider taking a nutritional supplement or a multivitamin if needed.  Stop smoking if you smoke. POSTURE  Sit and stand up straight. Avoid leaning forward when you sit or hunching over when you stand.  Choose chairs with good low back (lumbar) support.  If you work at a desk, sit close to your work so you do not need to lean over. Keep your chin tucked in. Keep your neck drawn back and elbows bent at a right angle. Your arms should look like the letter "L."  Sit high and close to the steering wheel when you drive. Add a lumbar support to your car  seat if needed.  Avoid sitting or standing in one position for too long. Take breaks to get up, stretch, and walk around at least once every hour. Take breaks if you are driving for long periods of time.  Sleep on your side with your knees slightly bent, or sleep on your back with a pillow under your knees. Do not sleep on your stomach. LIFTING, TWISTING, AND REACHING  Avoid heavy lifting, especially repetitive lifting. If you must do heavy lifting:  Stretch before lifting.  Work slowly.  Rest between lifts.  Use carts and dollies to move objects when possible.  Make several small trips instead of carrying 1 heavy load.  Ask for help when you need it.  Ask for help when moving big, awkward objects.  Follow these steps when lifting:  Stand with your feet shoulder-width apart.  Get as close to the object as you can. Do not try to pick up heavy objects that are far from your body.  Use handles or lifting straps if they are available.  Bend at your knees. Squat down, but keep your heels off the floor.  Keep your shoulders pulled back, your chin tucked in, and your back straight.  Lift the object slowly, tightening the muscles in your legs, abdomen, and buttocks. Keep the object as close to the center of your body as possible.  When you put a load down, use these same guidelines in reverse.  Do not:  Lift the object above your waist.    Twist at the waist while lifting or carrying a load. Move your feet if you need to turn, not your waist.  Bend over without bending at your knees.  Avoid reaching over your head, across a table, or for an object on a high surface. OTHER TIPS  Avoid wet floors and keep sidewalks clear of ice to prevent falls.  Do not sleep on a mattress that is too soft or too hard.  Keep items that are used frequently within easy reach.  Put heavier objects on shelves at waist level and lighter objects on lower or higher shelves.  Find ways to  decrease your stress, such as exercise, massage, or relaxation techniques. Stress can build up in your muscles. Tense muscles are more vulnerable to injury.  Seek treatment for depression or anxiety if needed. These conditions can increase your risk of developing back pain. SEEK MEDICAL CARE IF:  You injure your back.  You have questions about diet, exercise, or other ways to prevent back injuries. MAKE SURE YOU:  Understand these instructions.  Will watch your condition.  Will get help right away if you are not doing well or get worse. Document Released: 04/23/2004 Document Revised: 06/08/2011 Document Reviewed: 04/27/2011 [facility] Patient Information 2015 [place], [place]. This information is not intended to replace advice given to you by your health care provider. Make sure you discuss any questions you have with your health care provider. Sciatica Sciatica is pain, weakness, numbness, or tingling along the path of the sciatic nerve. The nerve starts in the lower back and runs down the back of each leg. The nerve controls the muscles in the lower leg and in the back of the knee, while also providing sensation to the back of the thigh, lower leg, and the sole of your foot. Sciatica is a symptom of another medical condition. For instance, nerve damage or certain conditions, such as a herniated disk or bone spur on the spine, pinch or put pressure on the sciatic nerve. This causes the pain, weakness, or other sensations normally associated with sciatica. Generally, sciatica only affects one side of the body. CAUSES   Herniated or slipped disc.  Degenerative disk disease.  A pain disorder involving the narrow muscle in the buttocks (piriformis syndrome).  Pelvic injury or fracture.  Pregnancy.  Tumor (rare). SYMPTOMS  Symptoms can vary from mild to very severe. The symptoms usually travel from the low back to the buttocks and down the back of the leg. Symptoms can include:  Mild  tingling or dull aches in the lower back, leg, or hip.  Numbness in the back of the calf or sole of the foot.  Burning sensations in the lower back, leg, or hip.  Sharp pains in the lower back, leg, or hip.  Leg weakness.  Severe back pain inhibiting movement. These symptoms may get worse with coughing, sneezing, laughing, or prolonged sitting or standing. Also, being overweight may worsen symptoms. DIAGNOSIS  Your caregiver will perform a physical exam to look for common symptoms of sciatica. He or she may ask you to do certain movements or activities that would trigger sciatic nerve pain. Other tests may be performed to find the cause of the sciatica. These may include:  Blood tests.  X-rays.  Imaging tests, such as an MRI or CT scan. TREATMENT  Treatment is directed at the cause of the sciatic pain. Sometimes, treatment is not necessary and the pain and discomfort goes away on its own. If treatment is needed, your  caregiver may suggest:  Over-the-counter medicines to relieve pain.  Prescription medicines, such as anti-inflammatory medicine, muscle relaxants, or narcotics.  Applying heat or ice to the painful area.  Steroid injections to lessen pain, irritation, and inflammation around the nerve.  Reducing activity during periods of pain.  Exercising and stretching to strengthen your abdomen and improve flexibility of your spine. Your caregiver may suggest losing weight if the extra weight makes the back pain worse.  Physical therapy.  Surgery to eliminate what is pressing or pinching the nerve, such as a bone spur or part of a herniated disk. HOME CARE INSTRUCTIONS   Only take over-the-counter or prescription medicines for pain or discomfort as directed by your caregiver.  Apply ice to the affected area for 20 minutes, 3-4 times a day for the first 48-72 hours. Then try heat in the same way.  Exercise, stretch, or perform your usual activities if these do not aggravate  your pain.  Attend physical therapy sessions as directed by your caregiver.  Keep all follow-up appointments as directed by your caregiver.  Do not wear high heels or shoes that do not provide proper support.  Check your mattress to see if it is too soft. A firm mattress may lessen your pain and discomfort. SEEK IMMEDIATE MEDICAL CARE IF:   You lose control of your bowel or bladder (incontinence).  You have increasing weakness in the lower back, pelvis, buttocks, or legs.  You have redness or swelling of your back.  You have a burning sensation when you urinate.  You have pain that gets worse when you lie down or awakens you at night.  Your pain is worse than you have experienced in the past.  Your pain is lasting longer than 4 weeks.  You are suddenly losing weight without reason. MAKE SURE YOU:  Understand these instructions.  Will watch your condition.  Will get help right away if you are not doing well or get worse. Document Released: 03/10/2001 Document Revised: 09/15/2011 Document Reviewed: 07/26/2011 [employer] Patient Information 2015 [employer]. This information is not intended to replace advice given to you by your health care provider. Make sure you discuss any questions you have with your health care provider.  

## 2014-05-08 NOTE — Progress Notes (Signed)
Subjective:    Patient ID: Julie Perry, female    DOB: 1960-10-30, 54 y.o.   MRN: [id number]  HPI 54 year old patient who presents with a seven-day history of left lower back pain with radiation to the left leg.  She was seen in the ED a few days prior.  She has been on diazepam and hydrocodone with only minimal benefit.  Pain is aggravated by movement and standing and alleviated by rest in the supine position.  Denies any motor weakness.  No prior history of low back pain.  Past Medical History  Diagnosis Date  . Thyroid disease   . GERD (gastroesophageal reflux disease)     History   Social History  . Marital Status: Married    Spouse Name: N/A    Number of Children: N/A  . Years of Education: N/A   Occupational History  . Not on file.   Social History Main Topics  . Smoking status: Former Smoker    Types: Cigarettes  . Smokeless tobacco: Never Used  . Alcohol Use: Yes     Comment: Rarely once a year  . Drug Use: No  . Sexual Activity: Not on file   Other Topics Concern  . Not on file   Social History Narrative   Work or School: Engineer, technical sales Situation: lives with son (64 yo)      Spiritual Beliefs: Christian      Lifestyle: no regular exercise, diet is so so             No past surgical history on file.  No family history on file.  No Known Allergies  Current Outpatient Prescriptions on File Prior to Visit  Medication Sig Dispense Refill  . levothyroxine (SYNTHROID, LEVOTHROID) 75 MCG tablet Take 1 tablet (75 mcg total) by mouth daily. 90 tablet 3   No current facility-administered medications on file prior to visit.    BP 130/86 mmHg  Pulse 65  Temp(Src) 98.2 F (36.8 C) (Oral)  Resp 20  Ht  (1.575 m)  Wt 198 lb (89.812 kg)  BMI 36.21 kg/m2  SpO2 99%  LMP 08/12/2010      Review of Systems  Constitutional: Negative.   HENT: Negative for congestion, dental problem, hearing loss,  rhinorrhea, sinus pressure, sore throat and tinnitus.   Eyes: Negative for pain, discharge and visual disturbance.  Respiratory: Negative for cough and shortness of breath.   Cardiovascular: Negative for chest pain, palpitations and leg swelling.  Gastrointestinal: Negative for nausea, vomiting, abdominal pain, diarrhea, constipation, blood in stool and abdominal distention.  Genitourinary: Negative for dysuria, urgency, frequency, hematuria, flank pain, vaginal bleeding, vaginal discharge, difficulty urinating, vaginal pain and pelvic pain.  Musculoskeletal: Positive for back pain. Negative for joint swelling, arthralgias and gait problem.  Skin: Negative for rash.  Neurological: Negative for dizziness, syncope, speech difficulty, weakness, numbness and headaches.  Hematological: Negative for adenopathy.  Psychiatric/Behavioral: Negative for behavioral problems, dysphoric mood and agitation. The patient is not nervous/anxious.        Objective:   Physical Exam  Constitutional: She appears well-developed and well-nourished. No distress.  Musculoskeletal:  Straight leg test negative Full range of motion both hips Patellar reflexes normal Achilles reflexes.  Both blunted  Ankle flexion and extension normal          Assessment & Plan:   Low back pain with sciatica.  Will treat with Depo-Medrol.  Will continue  analgesics for symptom control.  Patient instructions dispensed Will return in 5-7 days if not dramatically improved.  She will report any worsening or new symptoms

## 2014-05-08 NOTE — Progress Notes (Signed)
Pre visit review using our clinic review tool, if applicable. No additional management support is needed unless otherwise documented below in the visit note. 

## 2014-05-25 ENCOUNTER — Telehealth: Payer: Self-pay | Admitting: Internal Medicine

## 2014-05-25 MED ORDER — OXYCODONE-ACETAMINOPHEN 5-325 MG PO TABS: 1.0000 | ORAL_TABLET | Freq: Four times a day (QID) | ORAL | Status: DC | PRN

## 2014-05-25 NOTE — Telephone Encounter (Signed)
Pt requesting refill on Oxycodone, last fill 2/9 # 30. Please advise.

## 2014-05-25 NOTE — Telephone Encounter (Signed)
Pt needs new rx oxycodone. Pt leaves work around 3 pm after 3 om call cell #

## 2014-05-25 NOTE — Telephone Encounter (Signed)
Pt came by office to get Rx told her this is the last prescription for Oxycodone and if low back continues will need to see specialist and consider MRI. Pt verbalized understanding. Rx printed and signed.

## 2014-05-25 NOTE — Telephone Encounter (Signed)
Left message on voicemail to call office.  

## 2014-05-25 NOTE — Telephone Encounter (Signed)
#  30.  Notify patient that this is last prescription for oxycodone.  If low back pain continues, we'll need to see back specialist and consider lumbar MRI

## 2014-08-17 ENCOUNTER — Other Ambulatory Visit: Payer: Self-pay | Admitting: Internal Medicine

## 2014-08-17 ENCOUNTER — Encounter: Payer: Self-pay | Admitting: Internal Medicine

## 2014-08-17 ENCOUNTER — Ambulatory Visit (INDEPENDENT_AMBULATORY_CARE_PROVIDER_SITE_OTHER): Payer: 59 | Admitting: Internal Medicine

## 2014-08-17 VITALS — BP 92/64 | HR 65 | Temp 98.1°F | Resp 18 | Ht 62.0 in | Wt 203.0 lb

## 2014-08-17 DIAGNOSIS — E039 Hypothyroidism, unspecified: Secondary | ICD-10-CM

## 2014-08-17 DIAGNOSIS — M549 Dorsalgia, unspecified: Secondary | ICD-10-CM

## 2014-08-17 LAB — TSH: TSH: 1.93 u[IU]/mL (ref 0.35–4.50)

## 2014-08-17 MED ORDER — LEVOTHYROXINE SODIUM 75 MCG PO TABS: 75.0000 ug | ORAL_TABLET | Freq: Every day | ORAL | Status: DC

## 2014-08-17 NOTE — Patient Instructions (Signed)
Back Injury Prevention Back injuries can be extremely painful and difficult to heal. After having one back injury, you are much more likely to experience another later on. It is important to learn how to avoid injuring or re-injuring your back. The following tips can help you to prevent a back injury. PHYSICAL FITNESS  Exercise regularly and try to develop good tone in your abdominal muscles. Your abdominal muscles provide a lot of the support needed by your back.  Do aerobic exercises (walking, jogging, biking, swimming) regularly.  Do exercises that increase balance and strength (tai chi, yoga) regularly. This can decrease your risk of falling and injuring your back.  Stretch before and after exercising.  Maintain a healthy weight. The more you weigh, the more stress is placed on your back. For every pound of weight, 10 times that amount of pressure is placed on the back. DIET  Talk to your caregiver about how much calcium and vitamin D you need per day. These nutrients help to prevent weakening of the bones (osteoporosis). Osteoporosis can cause broken (fractured) bones that lead to back pain.  Include good sources of calcium in your diet, such as dairy products, green, leafy vegetables, and products with calcium added (fortified).  Include good sources of vitamin D in your diet, such as milk and foods that are fortified with vitamin D.  Consider taking a nutritional supplement or a multivitamin if needed.  Stop smoking if you smoke. POSTURE  Sit and stand up straight. Avoid leaning forward when you sit or hunching over when you stand.  Choose chairs with good low back (lumbar) support.  If you work at a desk, sit close to your work so you do not need to lean over. Keep your chin tucked in. Keep your neck drawn back and elbows bent at a right angle. Your arms should look like the letter "L."  Sit high and close to the steering wheel when you drive. Add a lumbar support to your car  seat if needed.  Avoid sitting or standing in one position for too long. Take breaks to get up, stretch, and walk around at least once every hour. Take breaks if you are driving for long periods of time.  Sleep on your side with your knees slightly bent, or sleep on your back with a pillow under your knees. Do not sleep on your stomach. LIFTING, TWISTING, AND REACHING  Avoid heavy lifting, especially repetitive lifting. If you must do heavy lifting:  Stretch before lifting.  Work slowly.  Rest between lifts.  Use carts and dollies to move objects when possible.  Make several small trips instead of carrying 1 heavy load.  Ask for help when you need it.  Ask for help when moving big, awkward objects.  Follow these steps when lifting:  Stand with your feet shoulder-width apart.  Get as close to the object as you can. Do not try to pick up heavy objects that are far from your body.  Use handles or lifting straps if they are available.  Bend at your knees. Squat down, but keep your heels off the floor.  Keep your shoulders pulled back, your chin tucked in, and your back straight.  Lift the object slowly, tightening the muscles in your legs, abdomen, and buttocks. Keep the object as close to the center of your body as possible.  When you put a load down, use these same guidelines in reverse.  Do not:  Lift the object above your waist.    Twist at the waist while lifting or carrying a load. Move your feet if you need to turn, not your waist.  Bend over without bending at your knees.  Avoid reaching over your head, across a table, or for an object on a high surface. OTHER TIPS  Avoid wet floors and keep sidewalks clear of ice to prevent falls.  Do not sleep on a mattress that is too soft or too hard.  Keep items that are used frequently within easy reach.  Put heavier objects on shelves at waist level and lighter objects on lower or higher shelves.  Find ways to  decrease your stress, such as exercise, massage, or relaxation techniques. Stress can build up in your muscles. Tense muscles are more vulnerable to injury.  Seek treatment for depression or anxiety if needed. These conditions can increase your risk of developing back pain. SEEK MEDICAL CARE IF:  You injure your back.  You have questions about diet, exercise, or other ways to prevent back injuries. MAKE SURE YOU:  Understand these instructions.  Will watch your condition.  Will get help right away if you are not doing well or get worse. Document Released: 04/23/2004 Document Revised: 06/08/2011 Document Reviewed: 04/27/2011 [facility] Patient Information 2015 [place], [place]. This information is not intended to replace advice given to you by your health care provider. Make sure you discuss any questions you have with your health care provider. Mid-Back Strain with Rehab  A strain is an injury in which a tendon or muscle is torn. The muscles and tendons of the mid-back are vulnerable to strains. However, these muscles and tendons are very strong and require a great force to be injured. The muscles of the mid-back are responsible for stabilizing the spinal column, as well as spinal twisting (rotation). Strains are classified into three categories. Grade 1 strains cause pain, but the tendon is not lengthened. Grade 2 strains include a lengthened ligament, due to the ligament being stretched or partially ruptured. With grade 2 strains there is still function, although the function may be decreased. Grade 3 strains involve a complete tear of the tendon or muscle, and function is usually impaired. SYMPTOMS   Pain in the middle of the back.  Pain that may affect only one side, and is worse with movement.  Muscle spasms, and often swelling in the back.  Loss of strength of the back muscles.  Crackling sound (crepitation) when the muscles are touched. CAUSES  Mid-back strains occur when a force  is placed on the muscles or tendons that is greater than they can handle. Common causes of injury include:  Ongoing overuse of the muscle-tendon units in the middle back, usually from incorrect body posture.  A single violent injury or force applied to the back. RISK INCREASES WITH:  Sports that involve twisting forces on the spine or a lot of bending at the waist (football, rugby, weightlifting, bowling, golf, tennis, speed skating, racquetball, swimming, running, gymnastics, diving).  Poor strength and flexibility.  Failure to warm up properly before activity.  Family history of low back pain or disk disorders.  Previous back injury or surgery (especially fusion). PREVENTION  Learn and use proper sports technique.  Warm up and stretch properly before activity.  Allow for adequate recovery between workouts.  Maintain physical fitness:  Strength, flexibility, and endurance.  Cardiovascular fitness. PROGNOSIS  If treated properly, mid-back strains usually heal within 6 weeks. RELATED COMPLICATIONS   Frequently recurring symptoms, resulting in a chronic problem. Properly treating  the problem the first time decreases frequency of recurrence.  Chronic inflammation, scarring, and partial muscle-tendon tear.  Delayed healing or resolution of symptoms, especially if activity is resumed too soon.  Prolonged disability. TREATMENT Treatment first involves the use of ice and medicine, to reduce pain and inflammation. As the pain begins to subside, you may begin strengthening and stretching exercises to improve body posture and sport technique. These exercises may be performed at home or with a therapist. Severe injuries may require referral to a therapist for further evaluation and treatment, such as ultrasound. Corticosteroid injections may be given to help reduce inflammation. Biofeedback (watching monitors of your body processes) and psychotherapy may also be prescribed. Prolonged  bed rest is felt to do more harm than good. Massage may help break the muscle spasms. Sometimes, an injection of cortisone, with or without local anesthetics, may be given to help relieve the pain and spasms. MEDICATION   If pain medicine is needed, nonsteroidal anti-inflammatory medicines (aspirin and ibuprofen), or other minor pain relievers (acetaminophen), are often advised.  Do not take pain medicine for 7 days before surgery.  Prescription pain relievers may be given, if your caregiver thinks they are needed. Use only as directed and only as much as you need.  Ointments applied to the skin may be helpful.  Corticosteroid injections may be given by your caregiver. These injections should be reserved for the most serious cases, because they may only be given a certain number of times. HEAT AND COLD:   Cold treatment (icing) should be applied for 10 to 15 minutes every 2 to 3 hours for inflammation and pain, and immediately after activity that aggravates your symptoms. Use ice packs or an ice massage.  Heat treatment may be used before performing stretching and strengthening activities prescribed by your caregiver, physical therapist, or athletic trainer. Use a heat pack or a warm water soak. SEEK IMMEDIATE MEDICAL CARE IF:  Symptoms get worse or do not improve in 2 to 4 weeks, despite treatment.  You develop numbness, weakness, or loss of bowel or bladder function.  New, unexplained symptoms develop. (Drugs used in treatment may produce side effects.) EXERCISES RANGE OF MOTION (ROM) AND STRETCHING EXERCISES - Mid-Back Strain These exercises may help you when beginning to rehabilitate your injury. In order to successfully resolve your symptoms, you must improve your posture. These exercises are designed to help reduce the forward-head and rounded-shoulder posture which contributes to this condition. Your symptoms may resolve with or without further involvement from your physician,  physical therapist or athletic trainer. While completing these exercises, remember:   Restoring tissue flexibility helps normal motion to return to the joints. This allows healthier, less painful movement and activity.  An effective stretch should be held for at least 30 seconds.  A stretch should never be painful. You should only feel a gentle lengthening or release in the stretched tissue. STRETCH - Axial Extension  Stand or sit on a firm surface. Assume a good posture: chest up, shoulders drawn back, stomach muscles slightly tense, knees unlocked (if standing) and feet hip width apart.  Slowly retract your chin, so your head slides back and your chin slightly lowers. Continue to look straight ahead.  You should feel a gentle stretch in the back of your head. Be certain not to feel an aggressive stretch since this can cause headaches later.  Hold for __________ seconds. Repeat __________ times. Complete this exercise __________ times per day. RANGE OF MOTION- Upper Thoracic Extension  Sit on a firm chair with a high back. Assume a good posture: chest up, shoulders drawn back, abdominal muscles slightly tense, and feet hip width apart. Place a small pillow or folded towel in the curve of your lower back, if you are having difficulty maintaining good posture.  Gently brace your neck with your hands, allowing your arms to rest on your chest.  Continue to support your neck and slowly extend your back over the chair. You will feel a stretch across your upper back.  Hold __________ seconds. Slowly return to the starting position. Repeat __________ times. Complete this exercise __________ times per day. RANGE OF MOTION- Mid-Thoracic Extension  Roll a towel so that it is about 4 inches in diameter.  Position the towel lengthwise. Lay on the towel so that your spine, but not your shoulder blades, are supported.  You should feel your mid-back arching toward the floor. To increase the  stretch, extend your arms away from your body.  Hold for __________ seconds. Repeat exercise __________ times, __________ times per day. STRENGTHENING EXERCISES - Mid-Back Strain These exercises may help you when beginning to rehabilitate your injury. They may resolve your symptoms with or without further involvement from your physician, physical therapist or athletic trainer. While completing these exercises, remember:   Muscles can gain both the endurance and the strength needed for everyday activities through controlled exercises.  Complete these exercises as instructed by your physician, physical therapist or athletic trainer. Increase the resistance and repetitions only as guided by your caregiver.  You may experience muscle soreness or fatigue, but the pain or discomfort you are trying to eliminate should never worsen during these exercises. If this pain does worsen, stop and make certain you are following the directions exactly. If the pain is still present after adjustments, discontinue the exercise until you can discuss the trouble with your caregiver. STRENGTHENING - Quadruped, Opposite UE/LE Lift  Assume a hands and knees position on a firm surface. Keep your hands under your shoulders and your knees under your hips. You may place padding under your knees for comfort.  Find your neutral spine and gently tense your abdominal muscles so that you can maintain this position. Your shoulders and hips should form a rectangle that is parallel with the floor and is not twisted.  Keeping your trunk steady, lift your right hand no higher than your shoulder and then your left leg no higher than your hip. Make sure you are not holding your breath. Hold this position __________ seconds.  Continuing to keep your abdominal muscles tense and your back steady, slowly return to your starting position. Repeat with the opposite arm and leg. Repeat __________ times. Complete this exercise __________ times  per day.  STRENGTH - Shoulder Extensors  Secure a rubber exercise band or tubing to a fixed object (table, pole) so that it is at the height of your shoulders when you are either standing, or sitting on a firm armless chair.  With a thumbs-up grip, grasp an end of the band in each hand. Straighten your elbows and lift your hands straight in front of you at shoulder height. Step back away from the secured end of band, until it becomes tense.  Squeezing your shoulder blades together, pull your hands down to the sides of your thighs. Do not allow your hands to go behind you.  Hold for __________ seconds. Slowly ease the tension on the band, as you reverse the directions and return to the starting position.  Repeat __________ times. Complete this exercise __________ times per day.  STRENGTH - Horizontal Abductors Choose one of the two positions to complete this exercise. Prone: lying on stomach:  Lie on your stomach on a firm surface so that your right / left arm overhangs the edge. Rest your forehead on your opposite forearm. With your palm facing the floor and your elbow straight, hold a __________ weight in your hand.  Squeeze your right / left shoulder blade to your mid-back spine and then slowly raise your arm to the height of the bed.  Hold for __________ seconds. Slowly reverse the directions and return to the starting position, controlling the weight as you lower your arm. Repeat __________ times. Complete this exercise __________ times per day. Standing:   Secure a rubber exercise band or tubing, so that it is at the height of your shoulders when you are either standing, or sitting on a firm armless chair.  Grasp an end of the band in each hand and have your palms face each other. Straighten your elbows and lift your hands straight in front of you at shoulder height. Step back away from the secured end of band, until it becomes tense.  Squeeze your shoulder blades together. Keeping  your elbows locked and your hands at shoulder height, spread your arms apart, forming a "T" shape with your body. Hold __________ seconds. Slowly ease the tension on the band, as you reverse the directions and return to the starting position. Repeat __________ times. Complete this exercise __________ times per day. STRENGTH - Scapular Retractors and External Rotators, Rowing  Secure a rubber exercise band or tubing, so that it is at the height of your shoulders when you are either standing, or sitting on a firm armless chair.  With a palm-down grip, grasp an end of the band in each hand. Straighten your elbows and lift your hands straight in front of you at shoulder height. Step back away from the secured end of band, until it becomes tense.  Step 1: Squeeze your shoulder blades together. Bending your elbows, draw your hands to your chest as if you are rowing a boat. At the end of this motion, your hands and elbow should be at shoulder height and your elbows should be out to your sides.  Step 2: Rotate your shoulder to raise your hands above your head. Your forearms should be vertical and your upper arms should be horizontal.  Hold for __________ seconds. Slowly ease the tension on the band, as you reverse the directions and return to the starting position. Repeat __________ times. Complete this exercise __________ times per day.  POSTURE AND BODY MECHANICS CONSIDERATIONS - Mid-Back Strain Keeping correct posture when sitting, standing or completing your activities will reduce the stress put on different body tissues, allowing injured tissues a chance to heal and limiting painful experiences. The following are general guidelines for improved posture. Your physician or physical therapist will provide you with any instructions specific to your needs. While reading these guidelines, remember:  The exercises prescribed by your provider will help you have the flexibility and strength to maintain correct  postures.  The correct posture provides the best environment for your joints to work. All of your joints have less wear and tear when properly supported by a spine with good posture. This means you will experience a healthier, less painful body.  Correct posture must be practiced with all of your activities, especially prolonged sitting and standing. Correct posture is as important  when doing repetitive low-stress activities (typing) as it is when doing a single heavy-load activity (lifting). PROPER SITTING POSTURE In order to minimize stress and discomfort on your spine, you must sit with correct posture. Sitting with good posture should be effortless for a healthy body. Returning to good posture is a gradual process. Many people can work toward this most comfortably by using various supports until they have the flexibility and strength to maintain this posture on their own. When sitting with proper posture, your ears will fall over your shoulders and your shoulders will fall over your hips. You should use the back of the chair to support your upper back. Your lower back will be in a neutral position, just slightly arched. You may place a small pillow or folded towel at the base of your low back for  support.  When working at a desk, create an environment that supports good, upright posture. Without extra support, muscles fatigue and lead to excessive strain on joints and other tissues. Keep these recommendations in mind: CHAIR:  A chair should be able to slide under your desk when your back makes contact with the back of the chair. This allows you to work closely.  The chair's height should allow your eyes to be level with the upper part of your monitor and your hands to be slightly lower than your elbows. BODY POSITION  Your feet should make contact with the floor. If this is not possible, use a foot rest.  Keep your ears over your shoulders. This will reduce stress on your neck and lower  back. INCORRECT SITTING POSTURES If you are feeling tired and unable to assume a healthy sitting posture, do not slouch or slump. This puts excessive strain on your back tissues, causing more damage and pain. Healthier options include:  Using more support, like a lumbar pillow.  Switching tasks to something that requires you to be upright or walking.  Talking a brief walk.  Lying down to rest in a neutral-spine position. CORRECT STANDING POSTURES Proper standing posture should be assumed with all daily activities, even if they only take a few moments, like when brushing your teeth. As in sitting, your ears should fall over your shoulders and your shoulders should fall over your hips. You should keep a slight tension in your abdominal muscles to brace your spine. Your tailbone should point down to the ground, not behind your body, resulting in an over-extended swayback posture.  INCORRECT STANDING POSTURES Common incorrect standing postures include a forward head, locked knees, and an excessive swayback. WALKING Walk with an upright posture. Your ears, shoulders and hips should all line-up. CORRECT LIFTING TECHNIQUES DO :   Assume a wide stance. This will provide you more stability and the opportunity to get as close as possible to the object which you are lifting.  Tense your abdominals to brace your spine. Bend at the knees and hips. Keeping your back locked in a neutral-spine position, lift using your leg muscles. Lift with your legs, keeping your back straight.  Test the weight of unknown objects before attempting to lift them.  Try to keep your elbows locked down at your sides in order get the best strength from your shoulders when carrying an object.  Always ask for help when lifting heavy or awkward objects. INCORRECT LIFTING TECHNIQUES DO NOT:   Lock your knees when lifting, even if it is a small object.  Bend and twist. Pivot at your feet or move your feet when  needing to  change directions.  Assume that you can safely pick up even a paperclip without proper posture. Document Released: 03/16/2005 Document Revised: 07/31/2013 Document Reviewed: 06/28/2008 [facility] Patient Information 2015 [place], [place]. This information is not intended to replace advice given to you by your health care provider. Make sure you discuss any questions you have with your health care provider.

## 2014-08-17 NOTE — Progress Notes (Signed)
   Subjective:    Patient ID: Julie Perry, female    DOB: April 20, 1960, 54 y.o.   MRN: [id number]  HPI  54 year old patient who is seen today in follow-up.  She has hypothyroidism and has been on levothyroxine.  She was seen a couple months back with low back pain with left sciatica.  The intense pain has resolved, but she has daily chronic pain aggravated by daily activities.  There is no further radicular component.  Her left leg pain and numbness has resolved.  There has been no motor weakness. She works 2 jobs and has been quite active.  Past Medical History  Diagnosis Date  . Thyroid disease   . GERD (gastroesophageal reflux disease)     History   Social History  . Marital Status: Married    Spouse Name: N/A  . Number of Children: N/A  . Years of Education: N/A   Occupational History  . Not on file.   Social History Main Topics  . Smoking status: Former Smoker    Types: Cigarettes  . Smokeless tobacco: Never Used  . Alcohol Use: Yes     Comment: Rarely once a year  . Drug Use: No  . Sexual Activity: Not on file   Other Topics Concern  . 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  No family history on file.  No Known Allergies  No current outpatient prescriptions on file prior to visit.   No current facility-administered medications on file prior to visit.    BP 92/64 mmHg  Pulse 65  Temp(Src) 98.1 F (36.7 C) (Oral)  Resp 18  Ht 5\' 2"  (1.575 m)  Wt 203 lb (92.08 kg)  BMI 37.12 kg/m2  SpO2 94%  LMP 08/12/2010     Review of Systems  Constitutional: Negative.   HENT: Negative for congestion, dental problem, hearing loss, rhinorrhea, sinus pressure, sore throat and tinnitus.   Eyes: Negative for pain,  discharge and visual disturbance.  Respiratory: Negative for cough and shortness of breath.   Cardiovascular: Negative for chest pain, palpitations and leg swelling.  Gastrointestinal: Negative for nausea, vomiting, abdominal pain, diarrhea, constipation, blood in stool and abdominal distention.  Genitourinary: Negative for dysuria, urgency, frequency, hematuria, flank pain, vaginal bleeding, vaginal discharge, difficulty urinating, vaginal pain and pelvic pain.  Musculoskeletal: Positive for back pain. Negative for joint swelling, arthralgias and gait problem.  Skin: Negative for rash.  Neurological: Negative for dizziness, syncope, speech difficulty, weakness, numbness and headaches.  Hematological: Negative for adenopathy.  Psychiatric/Behavioral: Negative for behavioral problems, dysphoric mood and agitation. The patient is not nervous/anxious.        Objective:   Physical Exam  Constitutional: She appears well-developed and well-nourished.  Musculoskeletal:  No lumbar tenderness Straight leg test negative Neurovascular structures intact          Assessment & Plan:   Low back pain, improved.  No further radicular symptoms.  Patient was given instructions concerning lumbar strain.  Will observe at this time.  Will call if she has any new or worsening symptoms. Hypothyroidism.  We'll check a TSH

## 2014-08-17 NOTE — Progress Notes (Signed)
Pre visit review using our clinic review tool, if applicable. No additional management support is needed unless otherwise documented below in the visit note. 

## 2014-08-22 ENCOUNTER — Telehealth: Payer: Self-pay | Admitting: Family Medicine

## 2014-08-22 NOTE — Telephone Encounter (Signed)
Left a message for the pt to return my call.  Need to see if she has had her yearly mammogram.  If not, is she interested.  If so, when and where.

## 2015-07-19 ENCOUNTER — Ambulatory Visit: Payer: 59 | Admitting: Internal Medicine

## 2015-07-31 ENCOUNTER — Ambulatory Visit: Payer: 59 | Admitting: Internal Medicine

## 2015-08-01 ENCOUNTER — Encounter: Payer: Self-pay | Admitting: Internal Medicine

## 2015-08-01 ENCOUNTER — Ambulatory Visit (INDEPENDENT_AMBULATORY_CARE_PROVIDER_SITE_OTHER): Payer: BLUE CROSS/BLUE SHIELD | Admitting: Internal Medicine

## 2015-08-01 DIAGNOSIS — E039 Hypothyroidism, unspecified: Secondary | ICD-10-CM | POA: Diagnosis not present

## 2015-08-01 DIAGNOSIS — Z0000 Encounter for general adult medical examination without abnormal findings: Secondary | ICD-10-CM | POA: Diagnosis not present

## 2015-08-01 DIAGNOSIS — M5442 Lumbago with sciatica, left side: Secondary | ICD-10-CM

## 2015-08-01 LAB — CBC WITH DIFFERENTIAL/PLATELET
BASOS ABS: 0.1 10*3/uL (ref 0.0–0.1)
Basophils Relative: 1 % (ref 0.0–3.0)
EOS PCT: 4.4 % (ref 0.0–5.0)
Eosinophils Absolute: 0.3 10*3/uL (ref 0.0–0.7)
HEMATOCRIT: 37.5 % (ref 36.0–46.0)
Hemoglobin: 12.6 g/dL (ref 12.0–15.0)
LYMPHS PCT: 29.1 % (ref 12.0–46.0)
Lymphs Abs: 1.7 10*3/uL (ref 0.7–4.0)
MCHC: 33.5 g/dL (ref 30.0–36.0)
MCV: 87.7 fl (ref 78.0–100.0)
MONOS PCT: 6.7 % (ref 3.0–12.0)
Monocytes Absolute: 0.4 10*3/uL (ref 0.1–1.0)
Neutro Abs: 3.4 10*3/uL (ref 1.4–7.7)
Neutrophils Relative %: 58.8 % (ref 43.0–77.0)
Platelets: 210 10*3/uL (ref 150.0–400.0)
RBC: 4.28 Mil/uL (ref 3.87–5.11)
RDW: 13.9 % (ref 11.5–15.5)
WBC: 5.8 10*3/uL (ref 4.0–10.5)

## 2015-08-01 LAB — LIPID PANEL
CHOLESTEROL: 181 mg/dL (ref 0–200)
HDL: 57.8 mg/dL (ref >39.00)
LDL CALC: 108 mg/dL — AB (ref 0–99)
NonHDL: 123.67
TRIGLYCERIDES: 80 mg/dL (ref 0.0–149.0)
Total CHOL/HDL Ratio: 3
VLDL: 16 mg/dL (ref 0.0–40.0)

## 2015-08-01 LAB — COMPREHENSIVE METABOLIC PANEL
ALBUMIN: 4.1 g/dL (ref 3.5–5.2)
ALK PHOS: 72 U/L (ref 39–117)
ALT: 18 U/L (ref 0–35)
AST: 16 U/L (ref 0–37)
BILIRUBIN TOTAL: 0.4 mg/dL (ref 0.2–1.2)
BUN: 17 mg/dL (ref 6–23)
CALCIUM: 9.4 mg/dL (ref 8.4–10.5)
CO2: 29 mEq/L (ref 19–32)
Chloride: 104 mEq/L (ref 96–112)
Creatinine, Ser: 0.77 mg/dL (ref 0.40–1.20)
GFR: 82.14 mL/min (ref >60.00)
Glucose, Bld: 76 mg/dL (ref 70–99)
Potassium: 4.1 mEq/L (ref 3.5–5.1)
SODIUM: 141 meq/L (ref 135–145)
TOTAL PROTEIN: 7.1 g/dL (ref 6.0–8.3)

## 2015-08-01 LAB — TSH: TSH: 7.8 u[IU]/mL — AB (ref 0.35–4.50)

## 2015-08-01 MED ORDER — METHYLPREDNISOLONE ACETATE 80 MG/ML IJ SUSP
80.0000 mg | Freq: Once | INTRAMUSCULAR | Status: AC
  Administered 2015-08-01: 80 mg via INTRAMUSCULAR

## 2015-08-01 NOTE — Patient Instructions (Addendum)
It is important that you exercise regularly, at least 20 minutes 3 to 4 times per week.  If you develop chest pain or shortness of breath seek  medical attention.  You need to lose weight.  Consider a lower calorie diet and regular exercise.  Lumbar MRI as discussed  Take a calcium supplement, plus [phone] units of vitamin D  Call or return to clinic prn if these symptoms worsen or fail to improve as anticipated.   Back Exercises If you have pain in your back, do these exercises 2-3 times each day or as told by your doctor. When the pain goes away, do the exercises once each day, but repeat the steps more times for each exercise (do more repetitions). If you do not have pain in your back, do these exercises once each day or as told by your doctor. EXERCISES Single Knee to Chest Do these steps 3-5 times in a row for each leg: 1. Lie on your back on a firm bed or the floor with your legs stretched out. 2. Bring one knee to your chest. 3. Hold your knee to your chest by grabbing your knee or thigh. 4. Pull on your knee until you feel a gentle stretch in your lower back. 5. Keep doing the stretch for 10-30 seconds. 6. Slowly let go of your leg and straighten it. Pelvic Tilt Do these steps 5-10 times in a row: 1. Lie on your back on a firm bed or the floor with your legs stretched out. 2. Bend your knees so they point up to the ceiling. Your feet should be flat on the floor. 3. Tighten your lower belly (abdomen) muscles to press your lower back against the floor. This will make your tailbone point up to the ceiling instead of pointing down to your feet or the floor. 4. Stay in this position for 5-10 seconds while you gently tighten your muscles and breathe evenly. Cat-Cow Do these steps until your lower back bends more easily: 1. Get on your hands and knees on a firm surface. Keep your hands under your shoulders, and keep your knees under your hips. You may put padding under your  knees. 2. Let your head hang down, and make your tailbone point down to the floor so your lower back is round like the back of a cat. 3. Stay in this position for 5 seconds. 4. Slowly lift your head and make your tailbone point up to the ceiling so your back hangs low (sags) like the back of a cow. 5. Stay in this position for 5 seconds. Press-Ups Do these steps 5-10 times in a row: 1. Lie on your belly (face-down) on the floor. 2. Place your hands near your head, about shoulder-width apart. 3. While you keep your back relaxed and keep your hips on the floor, slowly straighten your arms to raise the top half of your body and lift your shoulders. Do not use your back muscles. To make yourself more comfortable, you may change where you place your hands. 4. Stay in this position for 5 seconds. 5. Slowly return to lying flat on the floor. Bridges Do these steps 10 times in a row: 1. Lie on your back on a firm surface. 2. Bend your knees so they point up to the ceiling. Your feet should be flat on the floor. 3. Tighten your butt muscles and lift your butt off of the floor until your waist is almost as high as your knees. If you do  not feel the muscles working in your butt and the back of your thighs, slide your feet 1-2 inches farther away from your butt. 4. Stay in this position for 3-5 seconds. 5. Slowly lower your butt to the floor, and let your butt muscles relax. If this exercise is too easy, try doing it with your arms crossed over your chest. Belly Crunches Do these steps 5-10 times in a row: 1. Lie on your back on a firm bed or the floor with your legs stretched out. 2. Bend your knees so they point up to the ceiling. Your feet should be flat on the floor. 3. Cross your arms over your chest. 4. Tip your chin a little bit toward your chest but do not bend your neck. 5. Tighten your belly muscles and slowly raise your chest just enough to lift your shoulder blades a tiny bit off of the  floor. 6. Slowly lower your chest and your head to the floor. Back Lifts Do these steps 5-10 times in a row: 1. Lie on your belly (face-down) with your arms at your sides, and rest your forehead on the floor. 2. Tighten the muscles in your legs and your butt. 3. Slowly lift your chest off of the floor while you keep your hips on the floor. Keep the back of your head in line with the curve in your back. Look at the floor while you do this. 4. Stay in this position for 3-5 seconds. 5. Slowly lower your chest and your face to the floor. GET HELP IF:  Your back pain gets a lot worse when you do an exercise.  Your back pain does not lessen 2 hours after you exercise. If you have any of these problems, stop doing the exercises. Do not do them again unless your doctor says it is okay. GET HELP RIGHT AWAY IF:  You have sudden, very bad back pain. If this happens, stop doing the exercises. Do not do them again unless your doctor says it is okay.   This information is not intended to replace advice given to you by your health care provider. Make sure you discuss any questions you have with your health care provider.   Document Released: 04/18/2010 Document Revised: 12/05/2014 Document Reviewed: 05/10/2014 Elsevier Interactive Patient Education [employer]

## 2015-08-01 NOTE — Progress Notes (Signed)
Pre visit review using our clinic review tool, if applicable. No additional management support is needed unless otherwise documented below in the visit note. 

## 2015-08-01 NOTE — Addendum Note (Signed)
Addended by: Jimmye NormanPHANOS, Reeda Soohoo J on: 08/01/2015 09:37 AM   Modules accepted: Orders

## 2015-08-01 NOTE — Progress Notes (Signed)
Subjective:    Patient ID: Julie Perry, female    DOB: 09/02/1960, 55 y.o.   MRN: [id number]  HPI  55 year old patient who has a history of exogenous obesity as well as hypothyroidism.  She has chronic intermittent low back pain with radiation down the left leg.  She was seen about one year ago and treated with Depo-Medrol and did well for a number of months.  For the past few months.  She has had intermittent low back pain with radiation to the left hamstring and also down as far as the left ankle and foot.  She works at [employer] and her job requires her to be on a hard surface throughout the day with considerable bending and stooping and lifting  Past Medical History  Diagnosis Date  . Thyroid disease   . GERD (gastroesophageal reflux disease)      Social History   Social History  . Marital Status: Married    Spouse Name: N/A  . Number of Children: N/A  . Years of Education: N/A   Occupational History  . Not on file.   Social History Main Topics  . Smoking status: Former Smoker    Types: Cigarettes  . Smokeless tobacco: Never Used  . Alcohol Use: Yes     Comment: Rarely once a year  . Drug Use: No  . Sexual Activity: Not on file   Other Topics Concern  . 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  No family history on file.  No Known Allergies  Current Outpatient Prescriptions on File Prior to Visit  Medication Sig Dispense Refill  . levothyroxine (SYNTHROID, LEVOTHROID) 75 MCG tablet TAKE ONE TABLET BY MOUTH ONCE DAILY 90 tablet 1   No current facility-administered medications on file prior to visit.    BP 120/70 mmHg  Pulse 67  Temp(Src) 98.3 F (36.8 C) (Oral)  Resp 20  Ht  (1.575 m)  Wt 206 lb (93.441  kg)  BMI 37.67 kg/m2  SpO2 99%  LMP 08/12/2010     Review of Systems  Constitutional: Negative.   HENT: Negative for congestion, dental problem, hearing loss, rhinorrhea, sinus pressure, sore throat and tinnitus.   Eyes: Negative for pain, discharge and visual disturbance.  Respiratory: Negative for cough and shortness of breath.   Cardiovascular: Negative for chest pain, palpitations and leg swelling.  Gastrointestinal: Negative for nausea, vomiting, abdominal pain, diarrhea, constipation, blood in stool and abdominal distention.  Genitourinary: Negative for dysuria, urgency, frequency, hematuria, flank pain, vaginal bleeding, vaginal discharge, difficulty urinating, vaginal pain and pelvic pain.  Musculoskeletal: Positive for back pain. Negative for joint swelling, arthralgias and gait problem.  Skin: Negative for rash.  Neurological: Negative for dizziness, syncope, speech difficulty, weakness, numbness and headaches.  Hematological: Negative for adenopathy.  Psychiatric/Behavioral: Negative for behavioral problems, dysphoric mood and agitation. The patient is not nervous/anxious.        Objective:   Physical Exam  Constitutional: She is oriented to person, place, and time. She appears well-developed and well-nourished.  HENT:  Head: Normocephalic.  Right Ear: External ear normal.  Left Ear: External ear  normal.  Mouth/Throat: Oropharynx is clear and moist.  Eyes: Conjunctivae and EOM are normal. Pupils are equal, round, and reactive to light.  Neck: Normal range of motion. Neck supple. No thyromegaly present.  Cardiovascular: Normal rate, regular rhythm, normal heart sounds and intact distal pulses.   Pulmonary/Chest: Effort normal and breath sounds normal.  Abdominal: Soft. Bowel sounds are normal. She exhibits no mass. There is no tenderness.  Musculoskeletal: Normal range of motion.  Negative straight leg test Able to walk on toes and heels  Lymphadenopathy:    She has  no cervical adenopathy.  Neurological: She is alert and oriented to person, place, and time.  Skin: Skin is warm and dry. No rash noted.  Psychiatric: She has a normal mood and affect. Her behavior is normal.          Assessment & Plan:   Chronic intermittent back pain with radiculopathy.  Options discussed.  Patient wishes to pursue with a lumbar MRI.  She had a very nice protracted response to Depo-Medrol.  Will repeat Hypothyroidism.  Check TSH Preventative health.  We'll check screening lab Exogenous obesity.  Weight loss encouraged

## 2015-08-05 ENCOUNTER — Ambulatory Visit
Admission: RE | Admit: 2015-08-05 | Discharge: 2015-08-05 | Disposition: A | Discharge status: Home / Self Care | Payer: BLUE CROSS/BLUE SHIELD | Source: Ambulatory Visit | Attending: Internal Medicine | Admitting: Internal Medicine

## 2015-08-05 DIAGNOSIS — M5442 Lumbago with sciatica, left side: Secondary | ICD-10-CM

## 2015-08-06 ENCOUNTER — Other Ambulatory Visit: Payer: Self-pay | Admitting: *Deleted

## 2015-08-06 ENCOUNTER — Telehealth: Payer: Self-pay | Admitting: Internal Medicine

## 2015-08-06 MED ORDER — TRAMADOL HCL 50 MG PO TABS: 50.0000 mg | ORAL_TABLET | Freq: Three times a day (TID) | ORAL | Status: DC | PRN

## 2015-08-06 MED ORDER — MELOXICAM 15 MG PO TABS: 15.0000 mg | ORAL_TABLET | Freq: Every day | ORAL | Status: DC

## 2015-08-06 MED ORDER — LEVOTHYROXINE SODIUM 75 MCG PO TABS: 75.0000 ug | ORAL_TABLET | Freq: Every day | ORAL | Status: DC

## 2015-08-06 NOTE — Telephone Encounter (Signed)
Pt received a call, not sure if it was her results of mri. Pt also had labs 5/4. Pt is on the way to work, can be reached after 3 pm

## 2015-08-07 NOTE — Telephone Encounter (Signed)
See result notes. 

## 2016-02-12 ENCOUNTER — Ambulatory Visit: Payer: BLUE CROSS/BLUE SHIELD | Admitting: Internal Medicine

## 2016-02-12 ENCOUNTER — Telehealth: Payer: Self-pay | Admitting: Internal Medicine

## 2016-02-12 MED ORDER — TRAMADOL HCL 50 MG PO TABS: 50.0000 mg | ORAL_TABLET | Freq: Three times a day (TID) | ORAL | 5 refills | Status: DC | PRN

## 2016-02-12 MED ORDER — LEVOTHYROXINE SODIUM 75 MCG PO TABS: 75.0000 ug | ORAL_TABLET | Freq: Every day | ORAL | 1 refills | Status: DC

## 2016-02-12 NOTE — Telephone Encounter (Signed)
Patient requesting RX refill on levothyroxine (SYNTHROID, LEVOTHROID) 75 MCG tablet and traMADol (ULTRAM) 50 MG tablet  Pharmacy: [facility] [place]  Contact Info: [phone]

## 2016-02-12 NOTE — Telephone Encounter (Signed)
Pt notified Rx's sent to pharmacy as requested. 

## 2016-03-06 ENCOUNTER — Ambulatory Visit: Payer: BLUE CROSS/BLUE SHIELD | Admitting: Internal Medicine

## 2016-03-16 ENCOUNTER — Ambulatory Visit (INDEPENDENT_AMBULATORY_CARE_PROVIDER_SITE_OTHER): Payer: BLUE CROSS/BLUE SHIELD | Admitting: Internal Medicine

## 2016-03-16 ENCOUNTER — Encounter: Payer: Self-pay | Admitting: Internal Medicine

## 2016-03-16 VITALS — BP 102/64 | HR 50 | Temp 98.4°F | Ht 62.0 in | Wt 200.8 lb

## 2016-03-16 DIAGNOSIS — E038 Other specified hypothyroidism: Secondary | ICD-10-CM | POA: Diagnosis not present

## 2016-03-16 DIAGNOSIS — M47896 Other spondylosis, lumbar region: Secondary | ICD-10-CM

## 2016-03-16 DIAGNOSIS — M47816 Spondylosis without myelopathy or radiculopathy, lumbar region: Secondary | ICD-10-CM

## 2016-03-16 MED ORDER — MELOXICAM 15 MG PO TABS: 15.0000 mg | ORAL_TABLET | Freq: Every day | ORAL | 3 refills | Status: DC

## 2016-03-16 MED ORDER — LEVOTHYROXINE SODIUM 75 MCG PO TABS: 75.0000 ug | ORAL_TABLET | Freq: Every day | ORAL | 4 refills | Status: DC

## 2016-03-16 NOTE — Progress Notes (Signed)
Pre visit review using our clinic review tool, if applicable. No additional management support is needed unless otherwise documented below in the visit note. 

## 2016-03-16 NOTE — Progress Notes (Signed)
Subjective:    Patient ID: Julie Perry, female    DOB: [birth year], 55 y.o.   MRN: [id number]  HPI  55 year old patient who is seen today in follow-up.  She has a history of intermittent low back pain.  MRI was obtained in the spring and revealed some facet arthritis and foraminal narrowing.  Does use Mobic sparingly.  She has some occasional GI distress with chronic use.  Does also use tramadol as needed She has hypothyroidism.  Thyroid studies were normal.  In the spring  Past Medical History:  Diagnosis Date  . GERD (gastroesophageal reflux disease)   . Thyroid disease      Social History   Social History  . Marital status: Married    Spouse name: N/A  . Number of children: N/A  . Years of education: N/A   Occupational History  . Not on file.   Social History Main Topics  . Smoking status: Former Smoker    Types: Cigarettes  . Smokeless tobacco: Never Used  . Alcohol use Yes     Comment: Rarely once a year  . Drug use: No  . Sexual activity: Not on file   Other Topics Concern  . 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 traMADol (ULTRAM) 50 MG tablet Take 1 tablet (50 mg total) by mouth every 8 (eight) hours as needed. 60 tablet 5   No current facility-administered medications on file prior to visit.     BP 102/64 (BP Location: Right Arm, Patient Position: Sitting, Cuff Size: Large)   Pulse (!) 50   Temp 98.4 F (36.9 C) (Oral)   Ht 5\' 2"  (1.575 m)   Wt 200 lb 12 oz (91.1 kg)   LMP 08/12/2010   SpO2 98%   BMI 36.72 kg/m     Review of Systems  Constitutional: Negative.   HENT: Negative for congestion, dental problem, hearing loss, rhinorrhea, sinus  pressure, sore throat and tinnitus.   Eyes: Negative for pain, discharge and visual disturbance.  Respiratory: Negative for cough and shortness of breath.   Cardiovascular: Negative for chest pain, palpitations and leg swelling.  Gastrointestinal: Negative for abdominal distention, abdominal pain, blood in stool, constipation, diarrhea, nausea and vomiting.  Genitourinary: Negative for difficulty urinating, dysuria, flank pain, frequency, hematuria, pelvic pain, urgency, vaginal bleeding, vaginal discharge and vaginal pain.  Musculoskeletal: Positive for back pain. Negative for arthralgias, gait problem and joint swelling.  Skin: Negative for rash.  Neurological: Negative for dizziness, syncope, speech difficulty, weakness, numbness and headaches.  Hematological: Negative for adenopathy.  Psychiatric/Behavioral: Negative for agitation, behavioral problems and dysphoric mood. The patient is not nervous/anxious.        Objective:   Physical Exam  Constitutional: She appears well-developed. No distress.  Blood pressure low normal  HENT:  Mouth/Throat: Oropharynx is clear and moist.  Neck: Neck supple. No thyromegaly present.  Cardiovascular: Normal rate and regular rhythm.   Pulmonary/Chest: Effort normal and breath sounds normal.  Musculoskeletal: She exhibits no edema.          Assessment & Plan:  Hypothyroidism.  Levothyroxine refilled Low back pain, Mobic  refilled  Follow-up 6 months or as needed No change in medical regimen  Rogelia BogaKWIATKOWSKI,Kylena Mole FRANK

## 2016-03-16 NOTE — Patient Instructions (Signed)
It is important that you exercise regularly, at least 20 minutes 3 to 4 times per week.  If you develop chest pain or shortness of breath seek  medical attention.  You need to lose weight.  Consider a lower calorie diet and regular exercise.  Return in 6 months for follow-up  

## 2016-05-15 ENCOUNTER — Other Ambulatory Visit: Payer: Self-pay | Admitting: Emergency Medicine

## 2016-05-15 NOTE — Telephone Encounter (Signed)
Levothyroxine refilled

## 2016-10-22 ENCOUNTER — Other Ambulatory Visit: Payer: Self-pay | Admitting: Internal Medicine

## 2016-12-31 ENCOUNTER — Ambulatory Visit (INDEPENDENT_AMBULATORY_CARE_PROVIDER_SITE_OTHER): Payer: BLUE CROSS/BLUE SHIELD | Admitting: Internal Medicine

## 2016-12-31 ENCOUNTER — Encounter: Payer: Self-pay | Admitting: Internal Medicine

## 2016-12-31 VITALS — BP 100/78 | HR 66 | Temp 98.3°F | Wt 195.0 lb

## 2016-12-31 DIAGNOSIS — M47816 Spondylosis without myelopathy or radiculopathy, lumbar region: Secondary | ICD-10-CM | POA: Diagnosis not present

## 2016-12-31 DIAGNOSIS — M25561 Pain in right knee: Secondary | ICD-10-CM | POA: Diagnosis not present

## 2016-12-31 MED ORDER — FLUTICASONE PROPIONATE 50 MCG/ACT NA SUSP: 2.0000 | Freq: Every day | NASAL | 6 refills | Status: DC

## 2016-12-31 MED ORDER — MELOXICAM 15 MG PO TABS
15.0000 mg | ORAL_TABLET | Freq: Every day | ORAL | 3 refills | Status: DC
Start: 2016-12-31 — End: 2017-04-22

## 2016-12-31 MED ORDER — TRAMADOL HCL 50 MG PO TABS: 50.0000 mg | ORAL_TABLET | Freq: Three times a day (TID) | ORAL | 0 refills | Status: DC | PRN

## 2016-12-31 NOTE — Patient Instructions (Signed)
Orthopedic consultation as discussed  You  may move around, but avoid painful motions and activities.  Apply ice to the sore area for 15 to 20 minutes  after overuse activities

## 2016-12-31 NOTE — Progress Notes (Signed)
Subjective:    Patient ID: Julie Perry, female    DOB: 11-08-1960, 56 y.o.   MRN: [id number]  HPI  56 year old patient who has a history of cervical and lumbar degenerative disease.  She has a long history of intermittent right knee pain that has been very uncomfortable for the past month she has been on meloxicam and tramadol, but still remains quite symptomatic.  She states the last night she slept poorly due to the pain Pain is located in the medial aspect of the knee as well as in the popliteal area.  She also describes some minimal calf discomfort She works at [employer] and spends her entire shift walking on concrete flooring.  Due to knee pain.  She does very little activities during her off hours. Pain is aggravated by standing and walking and alleviated by rest  Past Medical History:  Diagnosis Date  . GERD (gastroesophageal reflux disease)   . Thyroid disease      Social History   Social History  . Marital status: Married    Spouse name: N/A  . Number of children: N/A  . Years of education: N/A   Occupational History  . Not on file.   Social History Main Topics  . Smoking status: Former Smoker    Types: Cigarettes  . Smokeless tobacco: Never Used  . Alcohol use Yes     Comment: Rarely once a year  . Drug use: No  . Sexual activity: Not on file   Other Topics Concern  . 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  No family history on file.  No Known Allergies  Current Outpatient Prescriptions on File Prior to Visit  Medication Sig Dispense Refill  . levothyroxine (SYNTHROID, LEVOTHROID) 75 MCG tablet Take 1 tablet (75 mcg total) by mouth daily. 90 tablet 4  . meloxicam (MOBIC) 15 MG tablet Take 1 tablet (15 mg  total) by mouth daily. 30 tablet 3  . traMADol (ULTRAM) 50 MG tablet TAKE ONE TABLET BY MOUTH EVERY 8 HOURS AS NEEDED 60 tablet 0   No current facility-administered medications on file prior to visit.     BP 100/78 (BP Location: Left Arm, Patient Position: Sitting, Cuff Size: Normal)   Pulse 66   Temp 98.3 F (36.8 C) (Oral)   Wt 195 lb (88.5 kg)   LMP 08/12/2010   SpO2 98%   BMI 35.67 kg/m     Review of Systems  Constitutional: Negative.   HENT: Negative for congestion, dental problem, hearing loss, rhinorrhea, sinus pressure, sore throat and tinnitus.   Eyes: Negative for pain, discharge and visual disturbance.  Respiratory: Negative for cough and shortness of breath.   Cardiovascular: Negative for chest pain, palpitations and leg swelling.  Gastrointestinal: Negative for abdominal distention, abdominal pain, blood in stool, constipation, diarrhea, nausea and vomiting.  Genitourinary: Negative for difficulty urinating, dysuria, flank pain, frequency, hematuria, pelvic pain, urgency, vaginal bleeding, vaginal discharge and vaginal pain.  Musculoskeletal: Positive for gait problem. Negative for arthralgias and joint swelling.       Right knee pain  Skin: Negative for rash.  Neurological: Negative  for dizziness, syncope, speech difficulty, weakness, numbness and headaches.  Hematological: Negative for adenopathy.  Psychiatric/Behavioral: Negative for agitation, behavioral problems and dysphoric mood. The patient is not nervous/anxious.        Objective:   Physical Exam  Constitutional: She appears well-developed and well-nourished. No distress.  Musculoskeletal:  The right knee has a suggestion of mild increased warmth compared to the left knee No obvious effusion Slight tenderness along the medial joint line No popliteal tenderness or swelling Pain or swelling No lower extremity edema          Assessment & Plan:   Right knee pain.  Will continue her present  regimen.  Will set up for orthopedic referral for radiographs and consideration of cortisone injection  Health maintenance.  Flu vaccine, declined  Julie Perry

## 2016-12-31 NOTE — Progress Notes (Signed)
   Subjective:    Patient ID: Julie Perry, female    DOB: 1960/06/07, 56 y.o.   MRN: [id number]  HPI    Review of Systems     Objective:   Physical Exam        Assessment & Plan:

## 2017-01-08 ENCOUNTER — Ambulatory Visit (INDEPENDENT_AMBULATORY_CARE_PROVIDER_SITE_OTHER): Payer: Self-pay | Admitting: Orthopaedic Surgery

## 2017-01-12 ENCOUNTER — Ambulatory Visit (INDEPENDENT_AMBULATORY_CARE_PROVIDER_SITE_OTHER): Payer: Self-pay | Admitting: Orthopaedic Surgery

## 2017-01-14 ENCOUNTER — Ambulatory Visit (INDEPENDENT_AMBULATORY_CARE_PROVIDER_SITE_OTHER): Payer: BLUE CROSS/BLUE SHIELD | Admitting: Orthopaedic Surgery

## 2017-01-14 ENCOUNTER — Ambulatory Visit (INDEPENDENT_AMBULATORY_CARE_PROVIDER_SITE_OTHER): Payer: Self-pay

## 2017-01-14 ENCOUNTER — Encounter (INDEPENDENT_AMBULATORY_CARE_PROVIDER_SITE_OTHER): Payer: Self-pay | Admitting: Orthopaedic Surgery

## 2017-01-14 ENCOUNTER — Ambulatory Visit (INDEPENDENT_AMBULATORY_CARE_PROVIDER_SITE_OTHER): Payer: BLUE CROSS/BLUE SHIELD

## 2017-01-14 DIAGNOSIS — G8929 Other chronic pain: Secondary | ICD-10-CM | POA: Diagnosis not present

## 2017-01-14 DIAGNOSIS — M25561 Pain in right knee: Secondary | ICD-10-CM

## 2017-01-14 DIAGNOSIS — M25562 Pain in left knee: Secondary | ICD-10-CM

## 2017-01-14 DIAGNOSIS — M170 Bilateral primary osteoarthritis of knee: Secondary | ICD-10-CM | POA: Insufficient documentation

## 2017-01-14 DIAGNOSIS — M1711 Unilateral primary osteoarthritis, right knee: Secondary | ICD-10-CM

## 2017-01-14 DIAGNOSIS — M159 Polyosteoarthritis, unspecified: Secondary | ICD-10-CM | POA: Insufficient documentation

## 2017-01-14 DIAGNOSIS — M1712 Unilateral primary osteoarthritis, left knee: Secondary | ICD-10-CM

## 2017-01-14 MED ORDER — LIDOCAINE HCL 1 % IJ SOLN
2.0000 mL | INTRAMUSCULAR | Status: AC | PRN
  Administered 2017-01-14: 2 mL

## 2017-01-14 MED ORDER — BUPIVACAINE HCL 0.5 % IJ SOLN
2.0000 mL | INTRAMUSCULAR | Status: AC | PRN
  Administered 2017-01-14: 2 mL via INTRA_ARTICULAR

## 2017-01-14 MED ORDER — METHYLPREDNISOLONE ACETATE 40 MG/ML IJ SUSP
40.0000 mg | INTRAMUSCULAR | Status: AC | PRN
  Administered 2017-01-14: 40 mg via INTRA_ARTICULAR

## 2017-01-14 MED ORDER — DICLOFENAC SODIUM 1 % TD GEL: 2.0000 g | Freq: Four times a day (QID) | TRANSDERMAL | 5 refills | Status: DC

## 2017-01-14 NOTE — Progress Notes (Signed)
Office Visit Note   Patient: Julie Perry           Date of Birth: 03/22/1961           MRN: [id number] Visit Date: 01/14/2017              Requested by: Gordy SaversKwiatkowski, Peter F, MD [address] [place], [place] [address] PCP: Gordy SaversKwiatkowski, Peter F, MD   Assessment & Plan: Visit Diagnoses:  1. Chronic pain of both knees   2. Unilateral primary osteoarthritis, left knee   3. Unilateral primary osteoarthritis, right knee     Plan: X-ray show moderate to severe degenerative joint disease. We discussed various treatment options. She did undergo a right knee cortisone injection today. Prescription for Voltaren. We briefly discussed knee replacement surgery. We also discussed weight loss. Questions encouraged and answered.  Follow-Up Instructions: Return if symptoms worsen or fail to improve.   Orders:  Orders Placed This Encounter  Procedures  . Large Joint Injection/Arthrocentesis  . XR Knee 1-2 Views Left  . XR Knee 1-2 Views Right   Meds ordered this encounter  Medications  . diclofenac sodium (VOLTAREN) 1 % GEL    Sig: Apply 2 g topically 4 (four) times daily.    Dispense:  1 Tube    Refill:  5      Procedures: Large Joint Inj Date/Time: 01/14/2017 12:04 PM Performed by: Tarry KosXU, Tereka Thorley M Authorized by: Tarry KosXU, Aidin Doane M   Consent Given by:  Patient Timeout: prior to procedure the correct patient, procedure, and site was verified   Indications:  Pain Location:  Knee Site:  R knee Prep: patient was prepped and draped in usual sterile fashion   Needle Size:  22 G Approach:  Lateral Ultrasound Guidance: No   Fluoroscopic Guidance: No   Arthrogram: No   Medications:  2 mL bupivacaine 0.5 %; 2 mL lidocaine 1 %; 40 mg methylPREDNISolone acetate 40 MG/ML Patient tolerance:  Patient tolerated the procedure well with no immediate complications      Clinical Data: No additional findings.   Subjective: Chief Complaint  Patient presents with  . Left Knee - Pain    . Right Knee - Pain    Patient is a 56 year old female comes in with bilateral knee pain worse on the right.  She states that this is been going on for 2-3 months with chronic pain that is worse at night. She endorses start up stiffness and pain. Modic's not given her any relief. She stands at work. Denies any numbness and tingling.    Review of Systems  Constitutional: Negative.   HENT: Negative.   Eyes: Negative.   Respiratory: Negative.   Cardiovascular: Negative.   Endocrine: Negative.   Musculoskeletal: Negative.   Neurological: Negative.   Hematological: Negative.   Psychiatric/Behavioral: Negative.   All other systems reviewed and are negative.    Objective: Vital Signs: LMP 08/12/2010   Physical Exam  Constitutional: She is oriented to person, place, and time. She appears well-developed and well-nourished.  HENT:  Head: Normocephalic and atraumatic.  Eyes: EOM are normal.  Neck: Neck supple.  Pulmonary/Chest: Effort normal.  Abdominal: Soft.  Neurological: She is alert and oriented to person, place, and time.  Skin: Skin is warm. Capillary refill takes less than 2 seconds.  Psychiatric: She has a normal mood and affect. Her behavior is normal. Judgment and thought content normal.  Nursing note and vitals reviewed.   Ortho Exam Bilateral knee exam shows no joint effusion.  Collateral and cruciates are grossly intact. Normal range of motion. Specialty Comments:  No specialty comments available.  Imaging: Xr Knee 1-2 Views Left  Result Date: 01/14/2017 Moderate to severe degenerative joint disease  Xr Knee 1-2 Views Right  Result Date: 01/14/2017 Moderate to severe degenerative joint disease    PMFS History: Patient Active Problem List   Diagnosis Date Noted  . Unilateral primary osteoarthritis, left knee 01/14/2017  . Unilateral primary osteoarthritis, right knee 01/14/2017  . Osteoarthritis of facet joint of lumbar spine 03/16/2016  . Exogenous  obesity 06/22/2013  . FATIGUE 01/22/2009  . Hypothyroidism 01/20/2007  . GERD 01/20/2007   Past Medical History:  Diagnosis Date  . GERD (gastroesophageal reflux disease)   . Thyroid disease     No family history on file.  No past surgical history on file. Social History   Occupational History  . Not on file.   Social History Main Topics  . Smoking status: Former Smoker    Types: Cigarettes  . Smokeless tobacco: Never Used  . Alcohol use Yes     Comment: Rarely once a year  . Drug use: No  . Sexual activity: Not on file

## 2017-04-22 ENCOUNTER — Other Ambulatory Visit: Payer: Self-pay | Admitting: Internal Medicine

## 2017-04-22 ENCOUNTER — Telehealth: Payer: Self-pay | Admitting: Internal Medicine

## 2017-04-22 MED ORDER — TRAMADOL HCL 50 MG PO TABS: 50.0000 mg | ORAL_TABLET | Freq: Three times a day (TID) | ORAL | 0 refills | Status: DC | PRN

## 2017-04-22 MED ORDER — LEVOTHYROXINE SODIUM 75 MCG PO TABS: 75.0000 ug | ORAL_TABLET | Freq: Every day | ORAL | 2 refills | Status: DC

## 2017-04-22 MED ORDER — MELOXICAM 15 MG PO TABS: 15.0000 mg | ORAL_TABLET | Freq: Every day | ORAL | 3 refills | Status: DC

## 2017-04-22 NOTE — Telephone Encounter (Signed)
Copied from CRM [phone]. Topic: Quick Communication - Rx Refill/Question >> Apr 22, 2017 12:00 PM Zada GirtLander, Lumin L wrote: Medication: traMADol (ULTRAM) 50 MG tablet, meloxicam (MOBIC) 15 MG tablet, levothyroxine (SYNTHROID, LEVOTHROID) 75 MCG tablet    Has the patient contacted their pharmacy? No. (script expired)   (Agent: If no, request that the patient contact the pharmacy for the refill.)   Preferred Pharmacy (with phone number or street name): [facility] [address] - [place], [place] - [address] [place] [address] [place] [address] Phone: [phone] Fax: [phone]   Agent: Please be advised that RX refills may take up to 3 business days. We ask that you follow-up with your pharmacy.

## 2017-04-22 NOTE — Telephone Encounter (Signed)
Medication refills for traMadol, meloxicam and levothyroxine please.

## 2017-06-28 ENCOUNTER — Ambulatory Visit (INDEPENDENT_AMBULATORY_CARE_PROVIDER_SITE_OTHER): Payer: BLUE CROSS/BLUE SHIELD | Admitting: Orthopaedic Surgery

## 2017-07-01 ENCOUNTER — Ambulatory Visit (INDEPENDENT_AMBULATORY_CARE_PROVIDER_SITE_OTHER): Payer: BLUE CROSS/BLUE SHIELD | Admitting: Orthopaedic Surgery

## 2017-07-01 ENCOUNTER — Encounter (INDEPENDENT_AMBULATORY_CARE_PROVIDER_SITE_OTHER): Payer: Self-pay | Admitting: Orthopaedic Surgery

## 2017-07-01 DIAGNOSIS — M1712 Unilateral primary osteoarthritis, left knee: Secondary | ICD-10-CM | POA: Diagnosis not present

## 2017-07-01 DIAGNOSIS — M1711 Unilateral primary osteoarthritis, right knee: Secondary | ICD-10-CM | POA: Diagnosis not present

## 2017-07-01 MED ORDER — METHYLPREDNISOLONE ACETATE 40 MG/ML IJ SUSP
13.3300 mg | INTRAMUSCULAR | Status: AC | PRN
  Administered 2017-07-01: 13.33 mg via INTRA_ARTICULAR

## 2017-07-01 MED ORDER — BUPIVACAINE HCL 0.25 % IJ SOLN
0.6600 mL | INTRAMUSCULAR | Status: AC | PRN
  Administered 2017-07-01: .66 mL via INTRA_ARTICULAR

## 2017-07-01 MED ORDER — LIDOCAINE HCL 1 % IJ SOLN
3.0000 mL | INTRAMUSCULAR | Status: AC | PRN
  Administered 2017-07-01: 3 mL

## 2017-07-01 NOTE — Progress Notes (Signed)
Office Visit Note   Patient: Julie Perry           Date of Birth: 1961/01/22           MRN: [id number] Visit Date: 07/01/2017              Requested by: Gordy SaversKwiatkowski, Peter F, MD [address] [place], [place] [address] PCP: Gordy SaversKwiatkowski, Peter F, MD   Assessment & Plan: Visit Diagnoses:  1. Unilateral primary osteoarthritis, left knee   2. Unilateral primary osteoarthritis, right knee     Plan: The patient has moderate to severe osteoarthritis and would like to continue with conservative treatment to include cortisone injections.  We will proceed with bilateral knee cortisone injections today.  She will follow-up with us on an as-needed basis.  In the meantime, we will write her a work note to not work greater than 7 hours/day for the next 2 weeks and that is it.  She will call with concerns or questions in the meantime.  Follow-Up Instructions: Return if symptoms worsen or fail to improve.   Orders:  Orders Placed This Encounter  Procedures  . Large Joint Inj: bilateral knee   No orders of the defined types were placed in this encounter.     Procedures: Large Joint Inj: bilateral knee on 07/01/2017 9:32 AM Indications: pain Details: 22 G needle, anterolateral approach Medications (Right): 0.66 mL bupivacaine 0.25 %; 3 mL lidocaine 1 %; 13.33 mg methylPREDNISolone acetate 40 MG/ML Medications (Left): 0.66 mL bupivacaine 0.25 %; 3 mL lidocaine 1 %; 13.33 mg methylPREDNISolone acetate 40 MG/ML      Clinical Data: No additional findings.   Subjective: Chief Complaint  Patient presents with  . Right Knee - Pain, Follow-up  . Left Knee - Pain, Follow-up    HPI patient is a pleasant 57 year old female who presents our clinic today with recurrent bilateral knee pain right greater than left.  She has a long-standing history of osteoarthritis to both knees.  We saw her back in October 2018 where her right knee was injected with cortisone.  This significantly helped  until about 2 weeks ago.  No new injury or change in activity, however she does work at [employer] where she is standing on her feet on concrete floors 8+ hours per day.  The pain she has is to the entire knee both sides.  She describes this as a constant soreness worse after standing all day, going down stairs as well as going from seated to standing positions.  She is tried over-the-counter medications without relief of symptoms.  She is requesting bilateral cortisone injections today.  Of note, she is not diabetic.  Review of Systems as detailed in HPI.  All are reviewed and are negative.   Objective: Vital Signs: LMP 08/12/2010   Physical Exam well-developed well-nourished female in no acute distress.  Alert and oriented x3.  Ortho Exam examination of both knees shows trace effusion.  Range of motion 0-110 degrees.  Ligaments are stable.  She has slight joint line tenderness both sides.  She is neurovascular intact distally.  Specialty Comments:  No specialty comments available.  Imaging: No new imaging today.   PMFS History: Patient Active Problem List   Diagnosis Date Noted  . Unilateral primary osteoarthritis, left knee 01/14/2017  . Unilateral primary osteoarthritis, right knee 01/14/2017  . Osteoarthritis of facet joint of lumbar spine 03/16/2016  . Exogenous obesity 06/22/2013  . FATIGUE 01/22/2009  . Hypothyroidism 01/20/2007  . GERD  01/20/2007   Past Medical History:  Diagnosis Date  . GERD (gastroesophageal reflux disease)   . Thyroid disease     History reviewed. No pertinent family history.  History reviewed. No pertinent surgical history. Social History   Occupational History  . Not on file  Tobacco Use  . Smoking status: Former Smoker    Types: Cigarettes  . Smokeless tobacco: Never Used  Substance and Sexual Activity  . Alcohol use: Yes    Comment: Rarely once a year  . Drug use: No  . Sexual activity: Not on file

## 2017-08-18 ENCOUNTER — Other Ambulatory Visit: Payer: Self-pay | Admitting: Internal Medicine

## 2017-08-18 NOTE — Telephone Encounter (Signed)
Refill request for Tramadol, last filled on 04/22/17 #60    LOV: 12/31/16 Dr. Diana Eves in [place]

## 2017-08-18 NOTE — Telephone Encounter (Signed)
Copied from CRM [id number]. Topic: Quick Communication - Rx Refill/Question >> Aug 18, 2017 11:55 AM Arlyss Gandy, NT wrote: Medication: traMADol (ULTRAM) 50 MG tablet   Has the patient contacted their pharmacy? Yes.   (Agent: If no, request that the patient contact the pharmacy for the refill.) (Agent: If yes, when and what did the pharmacy advise?)  Preferred Pharmacy (with phone number or street name): [facility] [address] [place] - [address] [phone] (Phone) [phone] (Fax)      Agent: Please be advised that RX refills may take up to 3 business days. We ask that you follow-up with your pharmacy.

## 2017-08-19 NOTE — Telephone Encounter (Signed)
Okay for refill?  

## 2017-08-20 ENCOUNTER — Other Ambulatory Visit: Payer: Self-pay

## 2017-08-20 MED ORDER — TRAMADOL HCL 50 MG PO TABS: 50.0000 mg | ORAL_TABLET | Freq: Three times a day (TID) | ORAL | 0 refills | Status: DC | PRN

## 2017-08-25 ENCOUNTER — Ambulatory Visit: Payer: BLUE CROSS/BLUE SHIELD | Admitting: Internal Medicine

## 2017-08-25 ENCOUNTER — Encounter: Payer: Self-pay | Admitting: Internal Medicine

## 2017-08-25 VITALS — BP 100/60 | HR 70 | Temp 98.1°F | Wt 197.0 lb

## 2017-08-25 DIAGNOSIS — M47816 Spondylosis without myelopathy or radiculopathy, lumbar region: Secondary | ICD-10-CM

## 2017-08-25 DIAGNOSIS — M1711 Unilateral primary osteoarthritis, right knee: Secondary | ICD-10-CM | POA: Diagnosis not present

## 2017-08-25 DIAGNOSIS — E039 Hypothyroidism, unspecified: Secondary | ICD-10-CM

## 2017-08-25 LAB — COMPREHENSIVE METABOLIC PANEL
ALT: 17 U/L (ref 0–35)
AST: 16 U/L (ref 0–37)
Albumin: 3.9 g/dL (ref 3.5–5.2)
Alkaline Phosphatase: 61 U/L (ref 39–117)
BILIRUBIN TOTAL: 0.5 mg/dL (ref 0.2–1.2)
BUN: 22 mg/dL (ref 6–23)
CALCIUM: 9.1 mg/dL (ref 8.4–10.5)
CHLORIDE: 106 meq/L (ref 96–112)
CO2: 29 meq/L (ref 19–32)
Creatinine, Ser: 0.69 mg/dL (ref 0.40–1.20)
GFR: 92.56 mL/min (ref >60.00)
GLUCOSE: 81 mg/dL (ref 70–99)
POTASSIUM: 4 meq/L (ref 3.5–5.1)
Sodium: 140 mEq/L (ref 135–145)
Total Protein: 6.8 g/dL (ref 6.0–8.3)

## 2017-08-25 LAB — CBC WITH DIFFERENTIAL/PLATELET
BASOS ABS: 0 10*3/uL (ref 0.0–0.1)
BASOS PCT: 0.9 % (ref 0.0–3.0)
EOS PCT: 4.8 % (ref 0.0–5.0)
Eosinophils Absolute: 0.3 10*3/uL (ref 0.0–0.7)
HEMATOCRIT: 35.8 % — AB (ref 36.0–46.0)
Hemoglobin: 11.9 g/dL — ABNORMAL LOW (ref 12.0–15.0)
LYMPHS ABS: 1.6 10*3/uL (ref 0.7–4.0)
Lymphocytes Relative: 30.3 % (ref 12.0–46.0)
MCHC: 33.2 g/dL (ref 30.0–36.0)
MCV: 89.8 fl (ref 78.0–100.0)
MONOS PCT: 6.6 % (ref 3.0–12.0)
Monocytes Absolute: 0.4 10*3/uL (ref 0.1–1.0)
NEUTROS ABS: 3.1 10*3/uL (ref 1.4–7.7)
NEUTROS PCT: 57.4 % (ref 43.0–77.0)
PLATELETS: 183 10*3/uL (ref 150.0–400.0)
RBC: 3.99 Mil/uL (ref 3.87–5.11)
RDW: 14.2 % (ref 11.5–15.5)
WBC: 5.4 10*3/uL (ref 4.0–10.5)

## 2017-08-25 LAB — TSH: TSH: 4.3 u[IU]/mL (ref 0.35–4.50)

## 2017-08-25 MED ORDER — MELOXICAM 15 MG PO TABS: 15.0000 mg | ORAL_TABLET | Freq: Every day | ORAL | 3 refills | Status: DC

## 2017-08-25 MED ORDER — LEVOTHYROXINE SODIUM 75 MCG PO TABS: 75.0000 ug | ORAL_TABLET | Freq: Every day | ORAL | 2 refills | Status: DC

## 2017-08-25 MED ORDER — TRAMADOL HCL 50 MG PO TABS: 50.0000 mg | ORAL_TABLET | Freq: Three times a day (TID) | ORAL | 0 refills | Status: DC | PRN

## 2017-08-25 NOTE — Patient Instructions (Signed)
Limit your sodium (Salt) intake    It is important that you exercise regularly, at least 20 minutes 3 to 4 times per week.  If you develop chest pain or shortness of breath seek  medical attention.  You need to lose weight.  Consider a lower calorie diet and regular exercise.  Return in 6 months for follow-up   

## 2017-08-25 NOTE — Progress Notes (Signed)
Subjective:    Patient ID: Julie Perry, female    DOB: 1960/09/11, 57 y.o.   MRN: [id number]  HPI  57 year old patient who is seen today in follow-up.  She has a history of treated hypothyroidism and remains on levothyroxine. She is followed closely by orthopedics due to significant bilateral knee arthritis. She remains on Mobic almost daily as well as as needed tramadol. 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 Otherwise doing quite well except for bilateral knee pain  Wt Readings from Last 3 Encounters:  08/25/17 197 lb (89.4 kg)  12/31/16 195 lb (88.5 kg)  03/16/16 200 lb 12 oz (91.1 kg)    Past Medical History:  Diagnosis Date  . GERD (gastroesophageal reflux disease)   . Thyroid disease      Social History   Socioeconomic History  . Marital status: Married    Spouse name: Not on file  . Number of children: Not on file  . Years of education: Not on file  . Highest education level: Not on file  Occupational History  . Not on file  Social Needs  . Financial resource strain: Not on file  . Food insecurity:    Worry: Not on file    Inability: Not on file  . Transportation needs:    Medical: Not on file    Non-medical: Not on file  Tobacco Use  . Smoking status: Former Smoker    Types: Cigarettes  . Smokeless tobacco: Never Used  Substance and Sexual Activity  . Alcohol use: Yes    Comment: Rarely once a year  . Drug use: No  . Sexual activity: Not on file  Lifestyle  . Physical activity:    Days per week: Not on file    Minutes per session: Not on file  . Stress: Not on file  Relationships  . Social connections:    Talks on phone: Not on file    Gets together: Not on file    Attends religious service: Not on file    Active member of club or organization: Not on file    Attends meetings of clubs or organizations: Not on file    Relationship status: Not on file  . Intimate partner  violence:    Fear of current or ex partner: Not on file    Emotionally abused: Not on file    Physically abused: Not on file    Forced sexual activity: Not on file  Other Topics Concern  . Not on file  Social History Narrative   Work or School: Engineer, technical sales Situation: lives with son (56 yo)      Spiritual Beliefs: Christian      Lifestyle: no regular exercise, diet is so so             History reviewed. No pertinent surgical history.  History reviewed. No pertinent family history.  No Known Allergies  Current Outpatient Medications on File Prior to Visit  Medication Sig Dispense Refill  . diclofenac sodium (VOLTAREN) 1 % GEL Apply 2 g topically 4 (four) times daily. 1 Tube 5   No current facility-administered medications on file prior to visit.     BP 100/60 (BP Location: Right Arm, Patient Position: Sitting, Cuff Size: Large)   Pulse 70   Temp 98.1 F (36.7 C) (Oral)   Wt 197 lb (89.4 kg)  LMP 08/12/2010   SpO2 97%   BMI 36.03 kg/m   Review of Systems  Constitutional: Negative.   HENT: Negative for congestion, dental problem, hearing loss, rhinorrhea, sinus pressure, sore throat and tinnitus.   Eyes: Negative for pain, discharge and visual disturbance.  Respiratory: Negative for cough and shortness of breath.   Cardiovascular: Negative for chest pain, palpitations and leg swelling.  Gastrointestinal: Negative for abdominal distention, abdominal pain, blood in stool, constipation, diarrhea, nausea and vomiting.  Genitourinary: Negative for difficulty urinating, dysuria, flank pain, frequency, hematuria, pelvic pain, urgency, vaginal bleeding, vaginal discharge and vaginal pain.  Musculoskeletal: Positive for arthralgias and back pain. Negative for gait problem and joint swelling.  Skin: Negative for rash.  Neurological: Negative for dizziness, syncope, speech difficulty, weakness, numbness and headaches.    Hematological: Negative for adenopathy.  Psychiatric/Behavioral: Negative for agitation, behavioral problems and dysphoric mood. The patient is not nervous/anxious.        Objective:   Physical Exam  Constitutional: She is oriented to person, place, and time. She appears well-developed and well-nourished.  Weight blood   pressure well controlled 197    HENT:  Head: Normocephalic.  Right Ear: External ear normal.  Left Ear: External ear normal.  Mouth/Throat: Oropharynx is clear and moist.  Eyes: Pupils are equal, round, and reactive to light. Conjunctivae and EOM are normal.  Neck: Normal range of motion. Neck supple. No thyromegaly present.  Cardiovascular: Normal rate, regular rhythm, normal heart sounds and intact distal pulses.  Pulmonary/Chest: Effort normal and breath sounds normal.  Abdominal: Soft. Bowel sounds are normal. She exhibits no mass. There is no tenderness.  Musculoskeletal: Normal range of motion.  Lymphadenopathy:    She has no cervical adenopathy.  Neurological: She is alert and oriented to person, place, and time.  Skin: Skin is warm and dry. No rash noted.  Psychiatric: She has a normal mood and affect. Her behavior is normal.          Assessment & Plan:   Hypothyroidism.  We will check a TSH Bilateral knee DJD.  Will check CBC and chemistries  Weight loss encouraged Follow-up 6 to 12 months  KWIATKOWSKI,PETER Homero Fellers

## 2017-10-29 ENCOUNTER — Other Ambulatory Visit: Payer: Self-pay | Admitting: Internal Medicine

## 2017-10-29 NOTE — Telephone Encounter (Signed)
Rx refill request: ultram 50 mg        Last filled: 08/25/17  LOV: 08/25/17   PCP: Amador CunasKwiatkowski  Pharmacy: verified

## 2017-10-29 NOTE — Telephone Encounter (Signed)
Copied from CRM [id number]. Topic: Quick Communication - Rx Refill/Question >> Oct 29, 2017 12:10 PM Mcneil, Ja-Kwan wrote: Medication: traMADol (ULTRAM) 50 MG tablet  Has the patient contacted their pharmacy? no  Preferred Pharmacy (with phone number or street name): [facility] [address] - , [place] - [address] [phone] ([phone])   Agent: Please be advised that RX refills may take up to 3 business days. We ask that you follow-up with your pharmacy.

## 2017-11-01 MED ORDER — TRAMADOL HCL 50 MG PO TABS: 50.0000 mg | ORAL_TABLET | Freq: Three times a day (TID) | ORAL | 0 refills | Status: DC | PRN

## 2018-04-20 ENCOUNTER — Encounter: Payer: Self-pay | Admitting: Family Medicine

## 2018-04-20 ENCOUNTER — Ambulatory Visit (INDEPENDENT_AMBULATORY_CARE_PROVIDER_SITE_OTHER): Payer: Self-pay | Admitting: Family Medicine

## 2018-04-20 ENCOUNTER — Encounter: Payer: Self-pay | Admitting: *Deleted

## 2018-04-20 VITALS — BP 104/66 | HR 60 | Temp 98.0°F | Resp 12 | Ht 62.0 in | Wt 188.1 lb

## 2018-04-20 DIAGNOSIS — G894 Chronic pain syndrome: Secondary | ICD-10-CM

## 2018-04-20 DIAGNOSIS — Z9189 Other specified personal risk factors, not elsewhere classified: Secondary | ICD-10-CM

## 2018-04-20 DIAGNOSIS — M5442 Lumbago with sciatica, left side: Secondary | ICD-10-CM

## 2018-04-20 DIAGNOSIS — M170 Bilateral primary osteoarthritis of knee: Secondary | ICD-10-CM

## 2018-04-20 DIAGNOSIS — G4700 Insomnia, unspecified: Secondary | ICD-10-CM

## 2018-04-20 DIAGNOSIS — G8929 Other chronic pain: Secondary | ICD-10-CM

## 2018-04-20 DIAGNOSIS — Z1159 Encounter for screening for other viral diseases: Secondary | ICD-10-CM

## 2018-04-20 DIAGNOSIS — E039 Hypothyroidism, unspecified: Secondary | ICD-10-CM

## 2018-04-20 DIAGNOSIS — K219 Gastro-esophageal reflux disease without esophagitis: Secondary | ICD-10-CM

## 2018-04-20 LAB — TSH: TSH: 4.5 u[IU]/mL (ref 0.35–4.50)

## 2018-04-20 MED ORDER — TRAMADOL HCL 50 MG PO TABS: 50.0000 mg | ORAL_TABLET | Freq: Every day | ORAL | 3 refills | Status: DC | PRN

## 2018-04-20 MED ORDER — MELOXICAM 15 MG PO TABS: 15.0000 mg | ORAL_TABLET | Freq: Every day | ORAL | 2 refills | Status: DC

## 2018-04-20 MED ORDER — LEVOTHYROXINE SODIUM 75 MCG PO TABS: 75.0000 ug | ORAL_TABLET | Freq: Every day | ORAL | 2 refills | Status: DC

## 2018-04-20 MED ORDER — OMEPRAZOLE 20 MG PO CPDR: 20.0000 mg | DELAYED_RELEASE_CAPSULE | Freq: Every day | ORAL | 1 refills | Status: DC

## 2018-04-20 NOTE — Progress Notes (Signed)
HPI:   Julie Perry is a 58 y.o. female, who is here today to establish Perry.  Former PCP: Dr. Amador CunasKwiatkowski. Last preventive routine visit: 2015  Chronic medical problems: Chronic pain, lower back pain with radiation, hypothyroidism, and knee OA among some.   Concerns today: She needs refills on medications.   Heartburn, not related with food. She has not identified exacerbating or alleviating factors.  It is becoming more frequent for the past few months. She is skipping meals due to work schedule.  Denies abdominal pain, nausea, vomiting, changes in bowel habits, blood in stool or melena.  She drinks water through the day but it does not seem to help.  She has not taken OTC meds.  Hypothyroidism: Developed problem after her 3rd child many years ago. Currently she is on levothyroxine 75 mcg daily. Tolerating medication well, no side effects reported. She has not noted dysphagia, palpitations, abdominal pain, changes in bowel habits, tremor, cold/heat intolerance, or abnormal weight loss.    Lab Results  Component Value Date   TSH 4.30 08/25/2017    Chronic fatigue, stable. She tried gummy multi vitamins but she felt "weird", so discontinued. Denies depressed mood.  Insomnia, sometimes she has trouble falling asleep.  She takes ibuprofen PM sometimes.  Chronic pain: Back pain and knee pain.  She takes Tramadol for lower back pain, "pinched nerve." OA of back.  She takes Tramadol while working, prolonged walking,mopping and sweeping exacerbated back pain. Medications does not cause drowsiness. She can function well with medication. In average once daily.  LLB pain radiated LLE extremity, no numbness. Lumbar MRI in 07/2015: No saddle anesthesia or urine/bowel continence changes.  1. Lumbarization of the S1 segment with bilateral pars defects and bilateral slight foraminal stenosis without spondylolisthesis. 2. Moderate bilateral facet arthritis  at L3-4 with 1.5 mm spondylolisthesis  Bilateral knee pain,exacerbated by prolonged walking. Mobic 15 mg daily as needed.  No Hx of trauma. Pain is moderate. Alleviated by rest.   Review of Systems  Constitutional: Positive for fatigue. Negative for activity change, appetite change and fever.  HENT: Negative for mouth sores, nosebleeds and trouble swallowing.   Respiratory: Negative for cough, shortness of breath and wheezing.   Cardiovascular: Negative for chest pain, palpitations and leg swelling.  Gastrointestinal: Negative for abdominal pain, nausea and vomiting.       Negative for changes in bowel habits.  Endocrine: Negative for cold intolerance and heat intolerance.  Genitourinary: Negative for decreased urine volume and hematuria.  Musculoskeletal: Positive for arthralgias and back pain. Negative for gait problem and joint swelling.  Skin: Negative for color change and rash.  Neurological: Negative for syncope, weakness and headaches.  Psychiatric/Behavioral: Positive for sleep disturbance (sometimes). Negative for confusion.      No current outpatient medications on file prior to visit.   No current facility-administered medications on file prior to visit.      Past Medical History:  Diagnosis Date  . GERD (gastroesophageal reflux disease)   . Thyroid disease    No Known Allergies  History reviewed. No pertinent family history.  Social History   Socioeconomic History  . Marital status: Married    Spouse name: Not on file  . Number of children: Not on file  . Years of education: Not on file  . Highest education level: Not on file  Occupational History  . Not on file  Social Needs  . Financial resource strain: Not on file  . Food insecurity:  Worry: Not on file    Inability: Not on file  . Transportation needs:    Medical: Not on file    Non-medical: Not on file  Tobacco Use  . Smoking status: Former Smoker    Types: Cigarettes  . Smokeless  tobacco: Never Used  Substance and Sexual Activity  . Alcohol use: Yes    Comment: Rarely once a year  . Drug use: No  . Sexual activity: Not on file  Lifestyle  . Physical activity:    Days per week: Not on file    Minutes per session: Not on file  . Stress: Not on file  Relationships  . Social connections:    Talks on phone: Not on file    Gets together: Not on file    Attends religious service: Not on file    Active member of club or organization: Not on file    Attends meetings of clubs or organizations: Not on file    Relationship status: Not on file  Other Topics Concern  . Not on file  Social History Narrative   Work or School: Engineer, technical sales Situation: lives with son (50 yo)      Spiritual Beliefs: Christian      Lifestyle: no regular exercise, diet is so so             Vitals:   04/20/18 1006  BP: 104/66  Pulse: 60  Resp: 12  Temp: 98 F (36.7 C)  SpO2: 97%    Body mass index is 34.41 kg/m.   Physical Exam  Nursing note and vitals reviewed. Constitutional: She is oriented to person, place, and time. She appears well-developed. No distress.  HENT:  Head: Normocephalic and atraumatic.  Mouth/Throat: Oropharynx is clear and moist and mucous membranes are normal.  Eyes: Pupils are equal, round, and reactive to light. Conjunctivae are normal.  Cardiovascular: Normal rate and regular rhythm.  No murmur heard. Pulses:      Dorsalis pedis pulses are 2+ on the right side and 2+ on the left side.  Respiratory: Effort normal and breath sounds normal. No respiratory distress.  GI: Soft. She exhibits no mass. There is no hepatomegaly. There is no abdominal tenderness.  Musculoskeletal:        General: No edema.     Lumbar back: She exhibits spasm. She exhibits no tenderness.  Lymphadenopathy:    She has no cervical adenopathy.  Neurological: She is alert and oriented to person, place, and time. She has normal  strength. No cranial nerve deficit. Gait normal.  Skin: Skin is warm. No rash noted. No erythema.  Psychiatric: She has a normal mood and affect.  Well groomed, good eye contact.      ASSESSMENT AND PLAN:   Julie Perry.  Orders Placed This Encounter  Procedures  . TSH  . Hepatitis C antibody   Lab Results  Component Value Date   TSH 4.50 04/20/2018     Osteoarthritis of both knees Stable. Continue meloxicam 15 mg daily as needed. Discontinue Voltaren gel. She continues to use OTC IcyHot with lidocaine as needed. Weight loss may also help.  Hypothyroidism No changes in current management, will follow labs done today and will give further recommendations accordingly. Follow-up in a year, before if needed.  GERD GERD precautions discussed. Omeprazole 20 mg before breakfast recommended. Follow-up in 5 months, before if needed.  Chronic  pain disorder We discussed current recommendations in regard to chronic pain management. She has tolerated tramadol well, so she will continue 50 mg daily as needed. Continue meloxicam 15 mg daily as needed. We discussed side effects of medications. Medication contract signed today. [place] controlled substance website reviewed. Because she takes a low-dose of tramadol, I think it is appropriate to follow every 5 to 6 months, before if needed.  Insomnia This problem can contribute to her chronic fatigue. Recommend avoiding Advil PM to help with sleep, she is already taking meloxicam. Good sleep hygiene recommended. OTC Tylenol PM may also help.  Encounter for HCV screening test for high risk patient -     Hepatitis C antibody  Chronic left-sided low back pain with left-sided sciatica Pain has been well controlled with Tramadol. Continue avoiding trigger factors. Side effects of Tramadol and Mobic discussed. Med contract signed today.  -     meloxicam (MOBIC) 15 MG tablet; Take 1 tablet (15 mg  total) by mouth daily. -     traMADol (ULTRAM) 50 MG tablet; Take 1 tablet (50 mg total) by mouth daily as needed.   Return in about 5 months (around 09/19/2018) for F/U .      G. [place]az[place], MD  [facility]. [place] office.

## 2018-04-20 NOTE — Assessment & Plan Note (Signed)
Stable. Continue meloxicam 15 mg daily as needed. Discontinue Voltaren gel. She continues to use OTC IcyHot with lidocaine as needed. Weight loss may also help.

## 2018-04-20 NOTE — Patient Instructions (Addendum)
A few things to remember from today's visit:   Acquired hypothyroidism - Plan: TSH  Chronic pain disorder  Encounter for HCV screening test for high risk patient - Plan: Hepatitis C antibody  Chronic left-sided low back pain with left-sided sciatica   Please be sure medication list is accurate. If a new problem present, please set up appointment sooner than planned today.

## 2018-04-20 NOTE — Assessment & Plan Note (Signed)
This problem can contribute to her chronic fatigue. Recommend avoiding Advil PM to help with sleep, she is already taking meloxicam. Good sleep hygiene recommended. OTC Tylenol PM may also help.

## 2018-04-20 NOTE — Assessment & Plan Note (Signed)
No changes in current management, will follow labs done today and will give further recommendations accordingly. Follow-up in a year, before if needed. 

## 2018-04-20 NOTE — Assessment & Plan Note (Signed)
GERD precautions discussed. Omeprazole 20 mg before breakfast recommended. Follow-up in 5 months, before if needed.

## 2018-04-20 NOTE — Assessment & Plan Note (Signed)
We discussed current recommendations in regard to chronic pain management. She has tolerated tramadol well, so she will continue 50 mg daily as needed. Continue meloxicam 15 mg daily as needed. We discussed side effects of medications. Medication contract signed today. [place] controlled substance website reviewed. Because she takes a low-dose of tramadol, I think it is appropriate to follow every 5 to 6 months, before if needed.

## 2018-04-21 LAB — HEPATITIS C ANTIBODY
Hepatitis C Ab: NONREACTIVE
SIGNAL TO CUT-OFF: 0.04 (ref <1.00)

## 2018-04-26 ENCOUNTER — Telehealth: Payer: Self-pay | Admitting: Family Medicine

## 2018-04-26 NOTE — Telephone Encounter (Signed)
Left message on voicemail for pt to return call to office for results.    

## 2018-04-26 NOTE — Telephone Encounter (Signed)
Copied from CRM [id number]. Topic: General - Other >> Apr 26, 2018 11:19 AM Lynne Logan D wrote: Reason for CRM: Pt called for lab results. NT Unavailable. Please advise.

## 2018-10-03 ENCOUNTER — Ambulatory Visit (INDEPENDENT_AMBULATORY_CARE_PROVIDER_SITE_OTHER): Payer: Self-pay | Admitting: Family Medicine

## 2018-10-03 ENCOUNTER — Other Ambulatory Visit: Payer: Self-pay

## 2018-10-03 ENCOUNTER — Encounter: Payer: Self-pay | Admitting: Family Medicine

## 2018-10-03 VITALS — BP 110/70 | HR 72 | Temp 98.2°F | Resp 12 | Ht 62.0 in | Wt 209.2 lb

## 2018-10-03 DIAGNOSIS — G4700 Insomnia, unspecified: Secondary | ICD-10-CM

## 2018-10-03 DIAGNOSIS — M47816 Spondylosis without myelopathy or radiculopathy, lumbar region: Secondary | ICD-10-CM

## 2018-10-03 DIAGNOSIS — G894 Chronic pain syndrome: Secondary | ICD-10-CM

## 2018-10-03 DIAGNOSIS — M1711 Unilateral primary osteoarthritis, right knee: Secondary | ICD-10-CM

## 2018-10-03 MED ORDER — TIZANIDINE HCL 4 MG PO TABS: 4.0000 mg | ORAL_TABLET | Freq: Three times a day (TID) | ORAL | 0 refills | Status: AC | PRN

## 2018-10-03 MED ORDER — TRAMADOL HCL 50 MG PO TABS: 50.0000 mg | ORAL_TABLET | Freq: Every day | ORAL | 3 refills | Status: DC | PRN

## 2018-10-03 MED ORDER — DULOXETINE HCL 30 MG PO CPEP: 30.0000 mg | ORAL_CAPSULE | Freq: Every day | ORAL | 1 refills | Status: DC

## 2018-10-03 MED ORDER — METHYLPREDNISOLONE ACETATE 40 MG/ML IJ SUSP
40.0000 mg | Freq: Once | INTRAMUSCULAR | Status: AC
  Administered 2018-10-03: 40 mg via INTRAMUSCULAR

## 2018-10-03 NOTE — Assessment & Plan Note (Addendum)
No changes in tramadol 50 mg daily. We discussed some side effects and the risk of medication interaction with Cymbalta. Cymbalta 30 mg daily.  Because acute exacerbation recommend Zanaflex 4 mg to take 3 times per day as needed. LLE radiculopathy, so here in the office and after verbal consent she received Depo-Medrol 40 mg IM, which has helped in the past.  She will let me know in about 6 weeks if Cymbalta has helped with pain. Instructed about warning signs.

## 2018-10-03 NOTE — Progress Notes (Signed)
ACUTE VISIT   HPI:  Chief Complaint  Patient presents with  . Back Pain    need med refilld    [name] is a 58 y.o. female, who is here today complaining of worsening back pain for the past 2 wweks No history of trauma or unusual physical activity. She denies associated fever, chills, more fatigue than usual, or abnormal weight loss.  Pain is interfering with daily activities, she cannot complete chores around the house.  History of chronic pain, lower back pain and knee pain mainly. Currently she is on tramadol 50 mg daily as needed. Back pain radiated to left lower extremity. She denies lower extremity numbness/tingling, saddle anesthesia, or urinary/bowel incontinence.  Negative for abdominal pain, nausea, vomiting, changes in bowel habits, or urinary symptoms.  Worsening anxiety due to financial stress. She lives with her 33 years old son, who is not working at this time. Having difficulty sleeping. She try Unisom but did not help.  She denies suicidal thoughts.   Review of Systems  Constitutional: Positive for activity change. Negative for appetite change.  Respiratory: Negative for cough, shortness of breath and wheezing.   Cardiovascular: Negative for chest pain and leg swelling.  Gastrointestinal: Negative for blood in stool and rectal pain.  Genitourinary: Negative for decreased urine volume, dysuria and hematuria.  Musculoskeletal: Negative for gait problem and joint swelling.  Neurological: Negative for weakness and headaches.  Psychiatric/Behavioral: Negative for suicidal ideas.  Rest see pertinent positives and negatives per HPI.   Current Outpatient Medications on File Prior to Visit  Medication Sig Dispense Refill  . levothyroxine (SYNTHROID, LEVOTHROID) 75 MCG tablet Take 1 tablet (75 mcg total) by mouth daily. 90 tablet 2  . meloxicam (MOBIC) 15 MG tablet Take 1 tablet (15 mg total) by mouth daily. 90 tablet 2  . omeprazole  (PRILOSEC) 20 MG capsule Take 1 capsule (20 mg total) by mouth daily. 90 capsule 1   No current facility-administered medications on file prior to visit.      Past Medical History:  Diagnosis Date  . GERD (gastroesophageal reflux disease)   . Thyroid disease    No Known Allergies  Social History   Socioeconomic History  . Marital status: Married    Spouse name: Not on file  . Number of children: Not on file  . Years of education: Not on file  . Highest education level: Not on file  Occupational History  . Not on file  Social Needs  . Financial resource strain: Not on file  . Food insecurity    Worry: Not on file    Inability: Not on file  . Transportation needs    Medical: Not on file    Non-medical: Not on file  Tobacco Use  . Smoking status: Former Smoker    Types: Cigarettes  . Smokeless tobacco: Never Used  Substance and Sexual Activity  . Alcohol use: Yes    Comment: Rarely once a year  . Drug use: No  . Sexual activity: Not on file  Lifestyle  . Physical activity    Days per week: Not on file    Minutes per session: Not on file  . Stress: Not on file  Relationships  . Social Herbalist on phone: Not on file    Gets together: Not on file    Attends religious service: Not on file    Active member of club or organization: Not on file  Attends meetings of clubs or organizations: Not on file    Relationship status: Not on file  Other Topics Concern  . 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   Physical Exam  Nursing note and vitals reviewed. Constitutional: She is oriented to person, place, and time. She appears  well-developed. She does not appear ill. No distress.  HENT:  Head: Atraumatic.  Eyes: Pupils are equal, round, and reactive to light. Conjunctivae and EOM are normal.  Respiratory: Effort normal and breath sounds normal. No respiratory distress.  GI: Soft. She exhibits no mass. There is abdominal tenderness ("discomfort",no more than usual) in the epigastric area.  Musculoskeletal:        General: No edema.     Right knee: She exhibits decreased range of motion. She exhibits no swelling, no deformity and no erythema. Tenderness found.     Thoracic back: She exhibits no tenderness, no edema and no deformity.     Lumbar back: She exhibits spasm. She exhibits no tenderness, no bony tenderness, no edema and no deformity.     Comments: No significant deformity appreciated. No local edema or erythema appreciated, no suspicious lesions.   Neurological: She is alert and oriented to person, place, and time. She has normal strength.  Reflex Scores:      Patellar reflexes are 2+ on the right side and 2+ on the left side. SLR negative bilateral.  Skin: Skin is warm. No rash noted. No erythema.  Psychiatric: Her mood appears anxious.  Well groomed, good eye contact.   ASSESSMENT AND PLAN:  [name] was seen today for back pain.  Diagnoses and all orders for this visit:  Unilateral primary osteoarthritis, right knee Bilateral knee pain but right one is worse. Continue Mobic 50 mg daily. Cymbalta 30 mg added today. Weight loss may also help.  Osteoarthritis of facet joint of lumbar spine No changes in tramadol 50 mg daily. We discussed some side effects and the risk of medication interaction with Cymbalta. Cymbalta 30 mg daily.  Because acute exacerbation recommend Zanaflex 4 mg to take 3 times per day as needed. LLE radiculopathy, so here in the office and after verbal consent she received Depo-Medrol 40 mg IM, which has helped in the past.  She will let me know in about 6 weeks if  Cymbalta has helped with pain. Instructed about warning signs.   Insomnia, unspecified type Recommend OTC Melatonin 3-5 mg at bedtime. Good sleep hygiene. Because it seems to be ralated to anxiety,cymbalta may help. She will let me know in about 6 weeks if medication is helping.  Return in about 6 months (around 04/05/2019) for Pain.   -[name] was advised to seek immediate medical attention if sudden worsening symptoms or to follow if they persist or if new concerns arise.       Betty G. Swazil[place]Jordan, MD  [facility]. [place] office.

## 2018-10-03 NOTE — Assessment & Plan Note (Addendum)
Bilateral knee pain but right one is worse. Continue Mobic 50 mg daily. Cymbalta 30 mg added today. Weight loss may also help.

## 2018-10-03 NOTE — Patient Instructions (Signed)
A few things to remember from today's visit:   Chronic pain disorder - Plan: DULoxetine (CYMBALTA) 30 MG capsule, traMADol (ULTRAM) 50 MG tablet  Osteoarthritis of facet joint of lumbar spine - Plan: DULoxetine (CYMBALTA) 30 MG capsule  Unilateral primary osteoarthritis, right knee - Plan: DULoxetine (CYMBALTA) 30 MG capsule  Back pain with radiation - Plan: tiZANidine (ZANAFLEX) 4 MG tablet  Chronic left-sided low back pain with left-sided sciatica - Plan: traMADol (ULTRAM) 50 MG tablet  Osteoarthritis of both knees, unspecified osteoarthritis type - Plan: traMADol (ULTRAM) 50 MG tablet  Today Zanaflex 4 mg added for acute pain, you can take 1 every 8 hours but it causes drowsiness, so you may need to take it just at night. No changes in tramadol. Cymbalta 30 mg added today to take once daily. Please let me know in about 6 weeks if Cymbalta has helped with pain and anxiety.   Please be sure medication list is accurate. If a new problem present, please set up appointment sooner than planned today.

## 2018-11-08 ENCOUNTER — Encounter: Payer: Self-pay | Admitting: Orthopaedic Surgery

## 2018-11-08 ENCOUNTER — Ambulatory Visit (INDEPENDENT_AMBULATORY_CARE_PROVIDER_SITE_OTHER): Payer: Self-pay | Admitting: Orthopaedic Surgery

## 2018-11-08 ENCOUNTER — Ambulatory Visit (INDEPENDENT_AMBULATORY_CARE_PROVIDER_SITE_OTHER): Payer: Self-pay

## 2018-11-08 VITALS — Ht 62.0 in | Wt 209.2 lb

## 2018-11-08 DIAGNOSIS — M1711 Unilateral primary osteoarthritis, right knee: Secondary | ICD-10-CM

## 2018-11-08 MED ORDER — LIDOCAINE HCL 1 % IJ SOLN
2.0000 mL | INTRAMUSCULAR | Status: AC | PRN
  Administered 2018-11-08: 2 mL

## 2018-11-08 MED ORDER — BUPIVACAINE HCL 0.5 % IJ SOLN
2.0000 mL | INTRAMUSCULAR | Status: AC | PRN
  Administered 2018-11-08: 2 mL via INTRA_ARTICULAR

## 2018-11-08 MED ORDER — METHYLPREDNISOLONE ACETATE 40 MG/ML IJ SUSP
40.0000 mg | INTRAMUSCULAR | Status: AC | PRN
  Administered 2018-11-08: 40 mg via INTRA_ARTICULAR

## 2018-11-08 NOTE — Progress Notes (Signed)
   Office Visit Note   Patient: Julie Perry           Date of Birth: 1961/02/08           MRN: [id number] Visit Date: 11/08/2018              Requested by: Martinique, Betty G, MD [address] [place],  [place] [address] PCP: Martinique, Betty G, MD   Assessment & Plan: Visit Diagnoses:  1. Unilateral primary osteoarthritis, right knee     Plan: Impression is stable right knee degenerative joint disease.  We repeated a cortisone injection today which patient tolerated well.  We will see her back as needed.  Questions encouraged and answered.  Follow-Up Instructions: Return if symptoms worsen or fail to improve.   Orders:  Orders Placed This Encounter  Procedures  . XR KNEE 3 VIEW RIGHT   No orders of the defined types were placed in this encounter.     Procedures: Large Joint Inj: R knee on 11/08/2018 10:58 AM Indications: pain Details: 22 G needle  Arthrogram: No  Medications: 40 mg methylPREDNISolone acetate 40 MG/ML; 2 mL lidocaine 1 %; 2 mL bupivacaine 0.5 % Consent was given by the patient. Patient was prepped and draped in the usual sterile fashion.       Clinical Data: No additional findings.   Subjective: Chief Complaint  Patient presents with  . Right Knee - Pain, Follow-up    Julie Perry comes in today for follow-up of right knee degenerative joint disease.  Her last cortisone injection was over a year ago.  She is requesting another injection for her right knee today.  Unfortunately she has been laid off due to the COVID crisis.  She denies any swelling.  She is otherwise doing well. Plan   Review of Systems   Objective: Vital Signs: Ht 5\' 2"  (1.575 m)   Wt 209 lb 4 oz (94.9 kg)   LMP 08/12/2010   BMI 38.27 kg/m   Physical Exam  Ortho Exam No joint effusion of the right knee.  Normal range of motion.  Medial joint line tenderness.  Collaterals and cruciates are stable. Specialty Comments:  No specialty comments available.  Imaging: Xr  Knee 3 View Right  Result Date: 11/08/2018 Moderate tricompartmental degenerative joint disease worse in the medial compartment.    PMFS History: Patient Active Problem List   Diagnosis Date Noted  . Chronic pain disorder 04/20/2018  . Insomnia 04/20/2018  . Osteoarthritis of both knees 01/14/2017  . Unilateral primary osteoarthritis, right knee 01/14/2017  . Osteoarthritis of facet joint of lumbar spine 03/16/2016  . Exogenous obesity 06/22/2013  . FATIGUE 01/22/2009  . Hypothyroidism 01/20/2007  . GERD 01/20/2007   Past Medical History:  Diagnosis Date  . GERD (gastroesophageal reflux disease)   . Thyroid disease     No family history on file.  No past surgical history on file. Social History   Occupational History  . Not on file  Tobacco Use  . Smoking status: Former Smoker    Types: Cigarettes  . Smokeless tobacco: Never Used  Substance and Sexual Activity  . Alcohol use: Yes    Comment: Rarely once a year  . Drug use: No  . Sexual activity: Not on file

## 2019-02-20 ENCOUNTER — Telehealth: Payer: Self-pay

## 2019-02-20 NOTE — Telephone Encounter (Signed)
Please advise 

## 2019-02-20 NOTE — Telephone Encounter (Signed)
Copied from [place] [id number]. Topic: General - Inquiry >> Feb 20, 2019  9:08 AM Mathis Bud wrote: Reason for CRM: Patient states she went to [place] to pick up thyroid medication: levothyroxine (SYNTHROID, LEVOTHROID) 75 MCG tablet  Patient states [place] told her to reach out to office if they can change brands or medications. Call back [phone]  [facility] in [place]

## 2019-02-21 MED ORDER — LEVOTHYROXINE SODIUM 75 MCG PO TABS: 75.0000 ug | ORAL_TABLET | Freq: Every day | ORAL | 2 refills | Status: DC

## 2019-02-21 NOTE — Telephone Encounter (Signed)
Spoke with pt and she states pharmacy needs a new rx for the levothyroxine. Please send rx for generic to [facility] in chart.

## 2019-02-21 NOTE — Telephone Encounter (Signed)
Left a detailed message on verified voice mail.   

## 2019-02-21 NOTE — Telephone Encounter (Signed)
Rx sent in. Patient is aware.  

## 2019-02-21 NOTE — Telephone Encounter (Signed)
As far as she continues taking same generic medication (same pharmaceutical company) it is ok to continue Levothyroxine 75 mcg daily. Thanks, BJ

## 2019-04-06 ENCOUNTER — Telehealth: Payer: Self-pay | Admitting: Family Medicine

## 2019-04-07 NOTE — Telephone Encounter (Signed)
Error

## 2019-04-14 ENCOUNTER — Other Ambulatory Visit: Payer: Self-pay

## 2019-04-14 ENCOUNTER — Ambulatory Visit (INDEPENDENT_AMBULATORY_CARE_PROVIDER_SITE_OTHER): Payer: Self-pay | Admitting: Family Medicine

## 2019-04-14 ENCOUNTER — Encounter: Payer: Self-pay | Admitting: Family Medicine

## 2019-04-14 VITALS — BP 126/60 | HR 91 | Temp 96.4°F | Resp 12 | Ht 62.0 in | Wt 209.0 lb

## 2019-04-14 DIAGNOSIS — K219 Gastro-esophageal reflux disease without esophagitis: Secondary | ICD-10-CM

## 2019-04-14 DIAGNOSIS — E039 Hypothyroidism, unspecified: Secondary | ICD-10-CM

## 2019-04-14 DIAGNOSIS — R059 Cough, unspecified: Secondary | ICD-10-CM

## 2019-04-14 DIAGNOSIS — M47816 Spondylosis without myelopathy or radiculopathy, lumbar region: Secondary | ICD-10-CM

## 2019-04-14 DIAGNOSIS — R05 Cough: Secondary | ICD-10-CM

## 2019-04-14 DIAGNOSIS — G894 Chronic pain syndrome: Secondary | ICD-10-CM

## 2019-04-14 DIAGNOSIS — R1310 Dysphagia, unspecified: Secondary | ICD-10-CM

## 2019-04-14 LAB — BASIC METABOLIC PANEL
BUN: 14 mg/dL (ref 6–23)
CO2: 29 mEq/L (ref 19–32)
Calcium: 9.1 mg/dL (ref 8.4–10.5)
Chloride: 105 mEq/L (ref 96–112)
Creatinine, Ser: 0.73 mg/dL (ref 0.40–1.20)
GFR: 81.15 mL/min (ref >60.00)
Glucose, Bld: 76 mg/dL (ref 70–99)
Potassium: 4.2 mEq/L (ref 3.5–5.1)
Sodium: 141 mEq/L (ref 135–145)

## 2019-04-14 LAB — TSH: TSH: 4.53 u[IU]/mL — ABNORMAL HIGH (ref 0.35–4.50)

## 2019-04-14 MED ORDER — TRAMADOL HCL 50 MG PO TABS: 50.0000 mg | ORAL_TABLET | Freq: Every day | ORAL | 3 refills | Status: DC | PRN

## 2019-04-14 NOTE — Patient Instructions (Addendum)
A few things to remember from today's visit:   Cough  Chronic pain disorder - Plan: traMADol (ULTRAM) 50 MG tablet  Osteoarthritis of facet joint of lumbar spine - Plan: traMADol (ULTRAM) 50 MG tablet  Dysphagia, unspecified type  Gastroesophageal reflux disease, unspecified whether esophagitis present  Acquired hypothyroidism - Plan: TSH, Basic Metabolic Panel  Take Omeprazole daily, 30 min before breakfast.You can take it 2 times daily if problem doe snot improve. Continue monitoring cough.  Flonase or Nasocort at bedtime may help with nose congestion. [place] rubbed in your nose may help with dryness.Loel Dubonnet can aggravate problem.  Please be sure medication list is accurate. If a new problem present, please set up appointment sooner than planned today.

## 2019-04-14 NOTE — Progress Notes (Signed)
HPI:   [name] is a 59 y.o. female, who is here today for chronic disease management. She was last seen on 10/03/18.  Chronic pain, lower back and knees mainly. Lower back pain is sometimes radiated to LLE. Pain is moderate to severe. Alleviated by rest.  Lumbar MRI in 07/2015.  She is on Tramadol 50 mg daily as needed. Takes medication when she has to work. Some activities associated with her job aggravate pain: Standing,cleaning tables,mopping,and walking.Pain is also exacerbated by doing chores around her house.  She has not taken Mobic,forgot to take it. She is not longer on Cymbalta, made her feel "weird" and nausea.  Hypothyroidism: Currently she is on Levothyroxine 75 mcg daily.. Tolerating medication well, no side effects reported. She has not noted palpitations,tremor, cold/heat intolerance, or abnormal weight loss.  Lab Results  Component Value Date   TSH 4.50 04/20/2018   Today she is concerned about "choking a lot" and cough. Dysphagia for solids and sometimes with liquids. She wonders if problem is elated to thyroid disease.  Cough is not productive and worse at night mainly. Wheezing at night when lying down, it seems to be from nose. +Dry nose and congestion.  + Heartburn. She takes OTC Pepcid as needed. She is not taking Omeprazole.  Negative for abdominal pain,N/V,changes in bowel habits,or melena.  She has not had colonoscopy due to financial  Issues.  Review of Systems  Constitutional: Negative for activity change, appetite change, fatigue and fever.  HENT: Negative for mouth sores, nosebleeds and sore throat.   Cardiovascular: Negative for chest pain and leg swelling.  Gastrointestinal:       Negative for changes in bowel habits.  Genitourinary: Negative for decreased urine volume, dysuria and hematuria.  Musculoskeletal: Negative for gait problem and joint swelling.  Neurological: Negative for syncope, weakness, numbness and  headaches.  Rest of ROS, see pertinent positives sand negatives in HPI  Current Outpatient Medications on File Prior to Visit  Medication Sig Dispense Refill  . levothyroxine (SYNTHROID) 75 MCG tablet Take 1 tablet (75 mcg total) by mouth daily. 90 tablet 2  . meloxicam (MOBIC) 15 MG tablet Take 1 tablet (15 mg total) by mouth daily. 90 tablet 2  . omeprazole (PRILOSEC) 20 MG capsule Take 1 capsule (20 mg total) by mouth daily. 90 capsule 1   No current facility-administered medications on file prior to visit.   Past Medical History:  Diagnosis Date  . GERD (gastroesophageal reflux disease)   . Thyroid disease    No Known Allergies  Social History   Socioeconomic History  . Marital status: Married    Spouse name: Not on file  . Number of children: Not on file  . Years of education: Not on file  . Highest education level: Not on file  Occupational History  . Not on file  Tobacco Use  . Smoking status: Former Smoker    Types: Cigarettes  . Smokeless tobacco: Never Used  Substance and Sexual Activity  . Alcohol use: Yes    Comment: Rarely once a year  . Drug use: No  . Sexual activity: Not on file  Other Topics Concern  . Not on file  Social History Narrative   Work or School: Engineer, technical sales Situation: lives with son (9 yo)      Spiritual Beliefs: Christian      Lifestyle: no regular exercise, diet is so so  Social Determinants of Health   Financial Resource Strain:   . Difficulty of Paying Living Expenses: Not on file  Food Insecurity:   . Worried About Charity fundraiser in the Last Year: Not on file  . Ran Out of Food in the Last Year: Not on file  Transportation Needs:   . Lack of Transportation (Medical): Not on file  . Lack of Transportation (Non-Medical): Not on file  Physical Activity:   . Days of Exercise per Week: Not on file  . Minutes of Exercise per Session: Not on file  Stress:   .  Feeling of Stress : Not on file  Social Connections:   . Frequency of Communication with Friends and Family: Not on file  . Frequency of Social Gatherings with Friends and Family: Not on file  . Attends Religious Services: Not on file  . Active Member of Clubs or Organizations: Not on file  . Attends Archivist Meetings: Not on file  . Marital Status: Not on file    Vitals:   04/14/19 1017  BP: 126/60  Pulse: 91  Resp: 12  Temp: (!) 96.4 F (35.8 C)  SpO2: 98%   Body mass index is 38.23 kg/m.   Physical Exam  Nursing note and vitals reviewed. Constitutional: She is oriented to person, place, and time. She appears well-developed. No distress.  HENT:  Head: Normocephalic and atraumatic.  Mouth/Throat: Oropharynx is clear and moist and mucous membranes are normal.  Eyes: Pupils are equal, round, and reactive to light. Conjunctivae are normal.  Neck: No tracheal deviation present. Thyromegaly (mildly enlarged,no nodules appreciated) present.  Cardiovascular: Normal rate and regular rhythm.  No murmur heard. Respiratory: Effort normal and breath sounds normal. No respiratory distress.  GI: Soft. She exhibits no mass. There is no hepatomegaly. There is no abdominal tenderness.  Musculoskeletal:        General: No edema.     Lumbar back: No tenderness or bony tenderness.     Right knee: No deformity or erythema. Decreased range of motion (Crepitus.).     Comments: Lower back pain with movement on examination table. No signs of synovitis.   Lymphadenopathy:    She has no cervical adenopathy.  Neurological: She is alert and oriented to person, place, and time. She has normal strength. No cranial nerve deficit. Gait normal.  Skin: Skin is warm. No rash noted. No erythema.  Psychiatric: She has a normal mood and affect.  Well groomed, good eye contact.     ASSESSMENT AND PLAN:   [name] was seen today for chronic disease management.  Orders Placed This  Encounter  Procedures  . TSH  . Basic Metabolic Panel   Lab Results  Component Value Date   TSH 4.53 (H) 04/14/2019   Lab Results  Component Value Date   CREATININE 0.73 04/14/2019   BUN 14 04/14/2019   NA 141 04/14/2019   K 4.2 04/14/2019   CL 105 04/14/2019   CO2 29 04/14/2019    1. Osteoarthritis of facet joint of lumbar spine Tramadol still helping. She has some Mobic left,will try again and let me know if she wants to continue. Side effects of meds discussed. Wt loss will help.  - traMADol (ULTRAM) 50 MG tablet; Take 1 tablet (50 mg total) by mouth daily as needed.  Dispense: 30 tablet; Refill: 3  2. Chronic pain disorder Stable. Med contract is current. [place] controlled subs report reviewed. 30 tabs of Tramadol last  over a month, because she has no health insurance she can follow when refills are needed.  - traMADol (ULTRAM) 50 MG tablet; Take 1 tablet (50 mg total) by mouth daily as needed.  Dispense: 30 tablet; Refill: 3  3. Cough Possible etiologies discussed. ? GERD,COPD,allergies. Former smoker. She wants to hold on imaging, CXR. Instructed about warning sings.  4. Dysphagia, unspecified type I think is is related to GERD. Other possible etiologies discussed. If not better with PPI, she may need GI evaluation and EGD.  5. Gastroesophageal reflux disease, unspecified whether esophagitis present Resume Omeprazole 20 mg before breakfast, she can increase dose to bid if problem is not well controlled with one dose. GERD precautions recommended.  6. Acquired hypothyroidism No changes in current management, will follow labs done today and will give further recommendations accordingly. She wants to hold on thyroid US.   Return in about 6 months (around 10/12/2019) for When out of Tramadol..     Areon Cocuzza G. [place], MD  [facility]. [place] office.

## 2019-04-15 ENCOUNTER — Encounter: Payer: Self-pay | Admitting: Family Medicine

## 2019-04-15 MED ORDER — LEVOTHYROXINE SODIUM 88 MCG PO TABS: 88.0000 ug | ORAL_TABLET | Freq: Every day | ORAL | 1 refills | Status: DC

## 2019-06-20 ENCOUNTER — Other Ambulatory Visit: Payer: Self-pay

## 2019-06-21 ENCOUNTER — Encounter: Payer: Self-pay | Admitting: Family Medicine

## 2019-06-21 ENCOUNTER — Ambulatory Visit (INDEPENDENT_AMBULATORY_CARE_PROVIDER_SITE_OTHER): Payer: Self-pay

## 2019-06-21 ENCOUNTER — Ambulatory Visit (INDEPENDENT_AMBULATORY_CARE_PROVIDER_SITE_OTHER): Payer: Self-pay | Admitting: Family Medicine

## 2019-06-21 VITALS — BP 128/80 | HR 80 | Resp 12 | Ht 62.0 in | Wt 203.0 lb

## 2019-06-21 DIAGNOSIS — M5442 Lumbago with sciatica, left side: Secondary | ICD-10-CM

## 2019-06-21 DIAGNOSIS — G8929 Other chronic pain: Secondary | ICD-10-CM

## 2019-06-21 DIAGNOSIS — G894 Chronic pain syndrome: Secondary | ICD-10-CM

## 2019-06-21 DIAGNOSIS — M5441 Lumbago with sciatica, right side: Secondary | ICD-10-CM

## 2019-06-21 DIAGNOSIS — M170 Bilateral primary osteoarthritis of knee: Secondary | ICD-10-CM

## 2019-06-21 DIAGNOSIS — E039 Hypothyroidism, unspecified: Secondary | ICD-10-CM

## 2019-06-21 LAB — TSH: TSH: 2.59 u[IU]/mL (ref 0.35–4.50)

## 2019-06-21 MED ORDER — MELOXICAM 15 MG PO TABS: 15.0000 mg | ORAL_TABLET | Freq: Every day | ORAL | 2 refills | Status: DC

## 2019-06-21 MED ORDER — TIZANIDINE HCL 4 MG PO TABS: 2.0000 mg | ORAL_TABLET | Freq: Every evening | ORAL | 2 refills | Status: DC | PRN

## 2019-06-21 MED ORDER — TRAMADOL HCL 50 MG PO TABS: 50.0000 mg | ORAL_TABLET | Freq: Every day | ORAL | 2 refills | Status: DC | PRN

## 2019-06-21 NOTE — Assessment & Plan Note (Signed)
No changes in current management. Further recommendation will be given according to TSH results. 

## 2019-06-21 NOTE — Progress Notes (Signed)
HPI:  Julie Perry is a 59 y.o. female, who is here today c/o worsening lower back pain.  She was last seen on 04/14/19. Years Hx of low back pain radiated to LLE. No numbness, saddle anesthesia,or bladder/bowel dysfunction. She had lumbar MRI in 07/2015:Lumbarization of the S1 segment with bilateral pars defects and bilateral slight foraminal stenosis without spondylolisthesis.Moderate bilateral facet arthritis at L3-4 with 1.5 mm spondylolisthesis  She is on Tramadol 50 mg daily.  Problem is getting gradually worse for the past few weeks. Some activities at work exacerbated pain as well as washing dishes or heavy lifting.  Now pain is bilateral and radiating to RLE. "Little" numbness. Sometimes needle sensation of LE's interfere with sleep. Pain "all over" and arthralgias. No joint erythema or edema.  She has not been taking Meloxicam because pain was not bad.  Negative for fever,chills, night sweats,abdominal pain,urinary symptoms,or changes in bowel habits. She also has noticed some weight loss. States that she has no time to eat while she is working. She is eating more at night. No regular physical activity but she is active at work.  Hypothyroidism: She is on Levothyroxine 88 mcg daily. Last TSH was 4.53.  Review of Systems  Constitutional: Positive for activity change and fatigue. Negative for appetite change.  Respiratory: Negative for cough, shortness of breath and wheezing.   Gastrointestinal: Negative for nausea and vomiting.  Endocrine: Negative for cold intolerance and heat intolerance.  Genitourinary: Negative for decreased urine volume, dysuria and hematuria.  Musculoskeletal: Negative for gait problem.  Skin: Negative for rash.  Neurological: Negative for syncope, weakness and headaches.  Psychiatric/Behavioral: Positive for sleep disturbance. Negative for confusion.  Rest of ROS, see pertinent positives sand negatives in HPI  Current Outpatient  Medications on File Prior to Visit  Medication Sig Dispense Refill  . levothyroxine (SYNTHROID) 88 MCG tablet Take 1 tablet (88 mcg total) by mouth daily. 90 tablet 1  . omeprazole (PRILOSEC) 20 MG capsule Take 1 capsule (20 mg total) by mouth daily. 90 capsule 1   No current facility-administered medications on file prior to visit.   Past Medical History:  Diagnosis Date  . GERD (gastroesophageal reflux disease)   . Thyroid disease    No Known Allergies  Social History   Socioeconomic History  . Marital status: Married    Spouse name: Not on file  . Number of children: Not on file  . Years of education: Not on file  . Highest education level: Not on file  Occupational History  . Not on file  Tobacco Use  . Smoking status: Former Smoker    Types: Cigarettes  . Smokeless tobacco: Never Used  Substance and Sexual Activity  . Alcohol use: Yes    Comment: Rarely once a year  . Drug use: No  . Sexual activity: Not on file  Other Topics Concern  . 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 Difficulty of Paying Living Expenses:   Food Insecurity:   . Worried About Charity fundraiser in the Last Year:   . [employer]  of Food in the Last Year:   Transportation Needs:   . Freight forwarder (Medical):   Marland Kitchen Lack of Transportation (Non-Medical):   Physical Activity:   . Days of Exercise per Week:   . Minutes of Exercise per Session:   Stress:   . Feeling of Stress :   Social Connections:   . Frequency of Communication with Friends and Family:   . Frequency of Social Gatherings with Friends and Family:   . Attends Religious Services:   . Active Member of Clubs or Organizations:   . Attends Tax inspector Meetings:   Marland Kitchen Marital Status:    Vitals:   06/21/19 1109  BP: 128/80  Pulse: 80  Resp: 12  SpO2: 97%   Body mass index is 37.13 kg/m.  Physical Exam  Nursing note and vitals reviewed. Constitutional: She is oriented to person, place, and time. She appears well-developed. She does not appear ill. No distress.  HENT:  Head: Normocephalic and atraumatic.  Eyes: Conjunctivae are normal.  Cardiovascular: Normal rate and regular rhythm.  Respiratory: Effort normal and breath sounds normal. No respiratory distress.  GI: There is no hepatomegaly. There is no abdominal tenderness.  Musculoskeletal:        General: No edema.     Lumbar back: No tenderness or bony tenderness.     Right knee: No tenderness.     Left knee: No tenderness.     Comments: Mild bilateral crepitus upon ROM.  Neurological: She is alert and oriented to person, place, and time. She has normal strength. Gait normal.  Reflex Scores:      Patellar reflexes are 2+ on the right side and 2+ on the left side. SLR negative bilateral.  Skin: Skin is warm. No rash noted. No erythema.  Psychiatric: She has a normal mood and affect.  Well groomed, good eye contact.    ASSESSMENT AND PLAN:  Julie Perry was seen today for follow-up.  Orders Placed This Encounter  Procedures  . DG Lumbar Spine Complete   Chronic bilateral low back pain with bilateral sciatica Problem is getting worse. She did not tolerate Cymbalta. Lumbar X ray today. Because no health insurance ,she cannot afford lumbar MRI. After discussion of options,she would like to try muscle relaxant, Zanaflex 2-4 mg at bedtime. Side effects discussed. Tramadol increased from 50 mg qd to bid. Resume Meloxicam 15 mg daily prn.  If needed we could add gabapentin next visit.   Hypothyroidism No changes in current management. Further recommendation will be given according to TSH results.  Chronic pain disorder She understands current  recommendations in regard to chronic opioid use. Some side effects has been discussed. PDMP reviewed.  Osteoarthritis of both knees Resume meloxicam 15 mg daily as needed. Weight loss will also help. Recommend avoiding activities that may aggravate problem.   Return in about 2 months (around 08/21/2019).   Findley Blankenbaker G. [place], MD  [facility]. [place] office.    Orders Placed This Encounter  Procedures  . DG Lumbar Spine Complete   Increase tramadol to 50 mg twice daily. Zanaflex, muscle relaxant added today.  You can take this at bedtime. If lower extremity pain continues, will need to consider gabapentin.  We really need a lumbar MRI back because no insurance we can hold on this for now. Take prednisone with food. Do not take prednisone with Mobic.

## 2019-06-21 NOTE — Patient Instructions (Addendum)
Orders Placed This Encounter  Procedures  . DG Lumbar Spine Complete   Increase tramadol to 50 mg twice daily. Zanaflex, muscle relaxant added today.  You can take this at bedtime. If lower extremity pain continues, will need to consider gabapentin.  We really need a lumbar MRI back because no insurance we can hold on this for now. Take prednisone with food. Do not take prednisone with Mobic.

## 2019-06-21 NOTE — Assessment & Plan Note (Signed)
She understands current recommendations in regard to chronic opioid use. Some side effects has been discussed. PDMP reviewed.

## 2019-06-21 NOTE — Assessment & Plan Note (Signed)
Resume meloxicam 15 mg daily as needed. Weight loss will also help. Recommend avoiding activities that may aggravate problem.

## 2019-08-21 ENCOUNTER — Ambulatory Visit (INDEPENDENT_AMBULATORY_CARE_PROVIDER_SITE_OTHER): Payer: Self-pay | Admitting: Family Medicine

## 2019-08-21 ENCOUNTER — Other Ambulatory Visit: Payer: Self-pay

## 2019-08-21 ENCOUNTER — Encounter: Payer: Self-pay | Admitting: Family Medicine

## 2019-08-21 VITALS — BP 122/70 | HR 79 | Temp 97.8°F | Resp 12 | Ht 62.0 in | Wt 203.4 lb

## 2019-08-21 DIAGNOSIS — G8929 Other chronic pain: Secondary | ICD-10-CM

## 2019-08-21 DIAGNOSIS — G4700 Insomnia, unspecified: Secondary | ICD-10-CM

## 2019-08-21 DIAGNOSIS — M5442 Lumbago with sciatica, left side: Secondary | ICD-10-CM

## 2019-08-21 DIAGNOSIS — K5900 Constipation, unspecified: Secondary | ICD-10-CM

## 2019-08-21 DIAGNOSIS — M5441 Lumbago with sciatica, right side: Secondary | ICD-10-CM

## 2019-08-21 NOTE — Patient Instructions (Signed)
A few things to remember from today's visit:   Chronic bilateral low back pain with bilateral sciatica  Insomnia, unspecified type  Constipation, unspecified constipation type  Melatonin 1 to 2 hours after supper. No changes in dose of tramadol or Zanaflex. MiraLAX at night as needed for constipation. Adequate hydration and fiber intake.  If you need refills please call your pharmacy. Do not use My Chart to request refills or for acute issues that need immediate attention.    Please be sure medication list is accurate. If a new problem present, please set up appointment sooner than planned today.

## 2019-08-21 NOTE — Progress Notes (Signed)
Chief Complaint  Patient presents with  . Follow-up    2 month follow-up for pain   HPI:  [name] is a 59 y.o. female, who is here today for follow up.   She was last seen on 06/21/19, when Tramadol dose was increased from 50 mg daily to 50 mg bid prn. She takes Tramadol 50 mg when at work and able to perform all her job functions with no pain. She is also on Meloxicam 15 mg daily as needed.  Zanaflex at bedtime is also helping with back pain. Negative for saddle anesthesia, bladder/bowel dysfunction.  In general she is tolerating Tramadol well, except for mild worsening constipation.  + Hard stool. Negative for dyschezia.  She has not noted blood in stool,N/V, or abdominal pain. No fever,chills, abnormal wt loss.  She has not tried OTC medication.  She has not had colon cancer screening due to lack of insurance.  -She is also having difficulty falling and staying asleep. Exacerbated by stress. She got some Melatonin but has not started it.  Review of Systems  Constitutional: Positive for fatigue. Negative for activity change and appetite change.  HENT: Negative for mouth sores, sore throat and trouble swallowing.   Cardiovascular: Negative for leg swelling.  Endocrine: Negative for cold intolerance and heat intolerance.  Genitourinary: Negative for decreased urine volume, dysuria, hematuria, vaginal bleeding and vaginal discharge.       Negative for urine incontinence.  Musculoskeletal: Positive for arthralgias. Negative for gait problem.  Skin: Negative for rash.  Hematological: Negative for adenopathy.  Psychiatric/Behavioral: Positive for sleep disturbance. Negative for confusion. The patient is nervous/anxious.   Rest of ROS, see pertinent positives sand negatives in HPI  Current Outpatient Medications on File Prior to Visit  Medication Sig Dispense Refill  . levothyroxine (SYNTHROID) 88 MCG tablet Take 1 tablet (88 mcg total) by mouth daily. 90  tablet 1  . meloxicam (MOBIC) 15 MG tablet Take 1 tablet (15 mg total) by mouth daily. 90 tablet 2  . omeprazole (PRILOSEC) 20 MG capsule Take 1 capsule (20 mg total) by mouth daily. 90 capsule 1  . tiZANidine (ZANAFLEX) 4 MG tablet Take 0.5-1 tablets (2-4 mg total) by mouth at bedtime as needed for muscle spasms. 30 tablet 2  . traMADol (ULTRAM) 50 MG tablet Take 1 tablet (50 mg total) by mouth daily as needed. 60 tablet 2   No current facility-administered medications on file prior to visit.   Past Medical History:  Diagnosis Date  . GERD (gastroesophageal reflux disease)   . Thyroid disease    No Known Allergies  Social History   Socioeconomic History  . Marital status: Married    Spouse name: Not on file  . Number of children: Not on file  . Years of education: Not on file  . Highest education level: Not on file  Occupational History  . Not on file  Tobacco Use  . Smoking status: Former Smoker    Types: Cigarettes  . Smokeless tobacco: Never Used  Substance and Sexual Activity  . Alcohol use: Yes    Comment: Rarely once a year  . Drug use: No  . Sexual activity: Not on file  Other Topics Concern  . 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 Difficulty of Paying Living Expenses:   Food Insecurity:   . Worried About Charity fundraiser in the Last Year:   . Arboriculturist in the Last Year:   Transportation Needs:   . Film/video editor (Medical):   Marland Kitchen Lack of Transportation (Non-Medical):   Physical Activity:   . Days of Exercise per Week:   . Minutes of Exercise per Session:   Stress:   . Feeling of Stress :   Social Connections:   . Frequency of Communication with Friends and Family:     . Frequency of Social Gatherings with Friends and Family:   . Attends Religious Services:   . Active Member of Clubs or Organizations:   . Attends Archivist Meetings:   Marland Kitchen Marital Status:     Vitals:   08/21/19 1145  BP: 122/70  Pulse: 79  Resp: 12  Temp: 97.8 F (36.6 C)  SpO2: 96%   Body mass index is 37.2 kg/m.  Physical Exam  Nursing note and vitals reviewed. Constitutional: She is oriented to person, place, and time. She appears well-developed. No distress.  HENT:  Head: Normocephalic and atraumatic.  Eyes: Conjunctivae are normal.  Cardiovascular: Normal rate and regular rhythm.  No murmur heard. Pulses:      Dorsalis pedis pulses are 2+ on the right side and 2+ on the left side.  Respiratory: Effort normal and breath sounds normal. No respiratory distress.  GI: Soft. She exhibits no mass. There is no hepatomegaly. There is no abdominal tenderness.  Musculoskeletal:        General: No edema.     Lumbar back: No tenderness or bony tenderness.  Neurological: She is alert and oriented to person, place, and time. She has normal strength. No cranial nerve deficit. Gait normal.  Reflex Scores:      Patellar reflexes are 2+ on the right side and 2+ on the left side. She can walk on tip toes.  Skin: Skin is warm. No rash noted. No erythema.  Psychiatric: Her mood appears anxious.  Well groomed, good eye contact.   ASSESSMENT AND PLAN:  [name] was seen today for follow-up.  Diagnoses and all orders for this visit:  Chronic bilateral low back pain with bilateral sciatica Improved. Side effects of Tramadol and Zanaflex discussed. No changes in current management. Med contract signed today. [place] controlled med substance report reviewed.  Insomnia, unspecified type Good sleep hygiene. Melatonin 1-2 hours after dinner. If not better we could try Amitriptyline or trazodone.  Constipation, unspecified constipation type Aggravated by  Tramadol. Adequate fiber and fluid intake. OTC Miralax daily as needed. If not better Bisacodyl and/or linzess can be added.   Return in about 6 months (around 02/21/2020) for Pain management.    Julie Perry G. [place], MD  [facility]. [place] office.  A few things to remember from today's visit:  Melatonin 1 to 2 hours after supper. No changes in dose of tramadol or Zanaflex. MiraLAX at night as needed for constipation. Adequate hydration and fiber intake.  If you need refills please call your pharmacy. Do not use My Chart to request refills or for acute issues that need immediate attention.    Please be sure medication list is accurate. If a new problem present, please set up  appointment sooner than planned today.

## 2019-08-24 MED ORDER — TIZANIDINE HCL 4 MG PO TABS: 2.0000 mg | ORAL_TABLET | Freq: Every evening | ORAL | 2 refills | Status: DC | PRN

## 2019-10-11 ENCOUNTER — Other Ambulatory Visit: Payer: Self-pay | Admitting: Family Medicine

## 2019-10-11 DIAGNOSIS — E039 Hypothyroidism, unspecified: Secondary | ICD-10-CM

## 2019-11-05 ENCOUNTER — Emergency Department (HOSPITAL_COMMUNITY)
Admission: EM | Admit: 2019-11-05 | Discharge: 2019-11-05 | Disposition: A | Discharge status: Home / Self Care | Payer: Self-pay | Attending: Emergency Medicine | Admitting: Emergency Medicine

## 2019-11-05 ENCOUNTER — Other Ambulatory Visit: Payer: Self-pay

## 2019-11-05 ENCOUNTER — Encounter (HOSPITAL_COMMUNITY): Payer: Self-pay | Admitting: Emergency Medicine

## 2019-11-05 ENCOUNTER — Emergency Department (HOSPITAL_COMMUNITY): Payer: Worker's Compensation

## 2019-11-05 DIAGNOSIS — Y9289 Other specified places as the place of occurrence of the external cause: Secondary | ICD-10-CM | POA: Insufficient documentation

## 2019-11-05 DIAGNOSIS — Z79890 Hormone replacement therapy (postmenopausal): Secondary | ICD-10-CM | POA: Insufficient documentation

## 2019-11-05 DIAGNOSIS — S4991XA Unspecified injury of right shoulder and upper arm, initial encounter: Secondary | ICD-10-CM | POA: Insufficient documentation

## 2019-11-05 DIAGNOSIS — E039 Hypothyroidism, unspecified: Secondary | ICD-10-CM | POA: Insufficient documentation

## 2019-11-05 DIAGNOSIS — F17210 Nicotine dependence, cigarettes, uncomplicated: Secondary | ICD-10-CM | POA: Insufficient documentation

## 2019-11-05 DIAGNOSIS — Y9389 Activity, other specified: Secondary | ICD-10-CM | POA: Insufficient documentation

## 2019-11-05 DIAGNOSIS — Y990 Civilian activity done for income or pay: Secondary | ICD-10-CM | POA: Insufficient documentation

## 2019-11-05 DIAGNOSIS — M25511 Pain in right shoulder: Secondary | ICD-10-CM

## 2019-11-05 DIAGNOSIS — X500XXA Overexertion from strenuous movement or load, initial encounter: Secondary | ICD-10-CM | POA: Insufficient documentation

## 2019-11-05 MED ORDER — OXYCODONE-ACETAMINOPHEN 5-325 MG PO TABS
1.0000 | ORAL_TABLET | Freq: Once | ORAL | Status: DC
  Filled 2019-11-05: qty 1

## 2019-11-05 MED ORDER — OXYCODONE-ACETAMINOPHEN 5-325 MG PO TABS: 1.0000 | ORAL_TABLET | Freq: Four times a day (QID) | ORAL | 0 refills | Status: DC | PRN

## 2019-11-05 NOTE — Discharge Instructions (Addendum)
Follow-up with your family doctor or Dr. Romeo Apple

## 2019-11-05 NOTE — ED Triage Notes (Signed)
Pt states she has pain to right shoulder and neck from work injury that happened Friday.

## 2019-11-05 NOTE — ED Provider Notes (Signed)
[facility] EMERGENCY DEPARTMENT Provider Note   CSN: [id number] Arrival date & time: 11/05/19  7353     History Chief Complaint  Patient presents with  . Shoulder Pain    Julie Perry is a 59 y.o. female.  Patient states she was lifting something out of a trash can and hurt her right shoulder Friday  The history is provided by the patient. No language interpreter was used.  Shoulder Pain Location:  Shoulder Shoulder location:  R shoulder Injury: yes   Pain details:    Quality:  Aching   Radiates to:  Does not radiate   Severity:  Moderate   Onset quality:  Sudden   Timing:  Constant   Progression:  Waxing and waning Handedness:  Right-handed Associated symptoms: no back pain and no fatigue        Past Medical History:  Diagnosis Date  . GERD (gastroesophageal reflux disease)   . Thyroid disease     Patient Active Problem List   Diagnosis Date Noted  . Constipation 08/21/2019  . Chronic pain disorder 04/20/2018  . Insomnia 04/20/2018  . Osteoarthritis of both knees 01/14/2017  . Unilateral primary osteoarthritis, right knee 01/14/2017  . Osteoarthritis of facet joint of lumbar spine 03/16/2016  . Exogenous obesity 06/22/2013  . FATIGUE 01/22/2009  . Hypothyroidism 01/20/2007  . GERD 01/20/2007    History reviewed. No pertinent surgical history.   OB History   No obstetric history on file.     History reviewed. No pertinent family history.  Social History   Tobacco Use  . Smoking status: Former Smoker    Types: Cigarettes  . Smokeless tobacco: Never Used  Substance Use Topics  . Alcohol use: Yes    Comment: Rarely once a year  . Drug use: No    Home Medications Prior to Admission medications   Medication Sig Start Date End Date Taking? Authorizing Provider  levothyroxine (SYNTHROID) 88 MCG tablet Take 1 tablet by mouth once daily 10/11/19   [place], Betty G, MD  meloxicam (MOBIC) 15 MG tablet Take 1 tablet (15 mg total) by mouth daily.  06/21/19   [place], Betty G, MD  omeprazole (PRILOSEC) 20 MG capsule Take 1 capsule (20 mg total) by mouth daily. 04/20/18   [place], Betty G, MD  oxyCODONE-acetaminophen (PERCOCET/ROXICET) 5-325 MG tablet Take 1 tablet by mouth every 6 (six) hours as needed. 11/05/19   Bethann Berkshire, MD  tiZANidine (ZANAFLEX) 4 MG tablet Take 0.5-1 tablets (2-4 mg total) by mouth at bedtime as needed for muscle spasms. 08/24/19   [place], Betty G, MD  traMADol (ULTRAM) 50 MG tablet Take 1 tablet (50 mg total) by mouth daily as needed. 06/21/19   [place], Betty G, MD    Allergies    Patient has no known allergies.  Review of Systems   Review of Systems  Constitutional: Negative for appetite change and fatigue.  HENT: Negative for congestion, ear discharge and sinus pressure.   Eyes: Negative for discharge.  Respiratory: Negative for cough.   Cardiovascular: Negative for chest pain.  Gastrointestinal: Negative for abdominal pain and diarrhea.  Genitourinary: Negative for frequency and hematuria.  Musculoskeletal: Negative for back pain.       Right shoulder pain  Skin: Negative for rash.  Neurological: Negative for seizures and headaches.  Psychiatric/Behavioral: Negative for hallucinations.    Physical Exam Updated Vital Signs BP 100/61 (BP Location: Left Arm)   Pulse 74   Temp 99 F (37.2 C) (Oral)  Resp 17   Ht 5\' 2"  (1.575 m)   Wt 92.1 kg   LMP 08/12/2010   SpO2 98%   BMI 37.13 kg/m   Physical Exam Vitals and nursing note reviewed.  Constitutional:      Appearance: She is well-developed.  HENT:     Head: Normocephalic.     Nose: Nose normal.  Eyes:     General: No scleral icterus.    Conjunctiva/sclera: Conjunctivae normal.  Neck:     Thyroid: No thyromegaly.  Cardiovascular:     Rate and Rhythm: Normal rate and regular rhythm.     Heart sounds: No murmur heard.  No friction rub. No gallop.   Pulmonary:     Breath sounds: No stridor. No wheezing or rales.  Chest:     Chest wall:  No tenderness.  Abdominal:     General: There is no distension.     Tenderness: There is no abdominal tenderness. There is no rebound.  Musculoskeletal:     Cervical back: Neck supple.     Comments: Tender right shoulder  Lymphadenopathy:     Cervical: No cervical adenopathy.  Skin:    Findings: No erythema or rash.  Neurological:     Mental Status: She is alert and oriented to person, place, and time.     Motor: No abnormal muscle tone.     Coordination: Coordination normal.  Psychiatric:        Behavior: Behavior normal.     ED Results / Procedures / Treatments   Labs (all labs ordered are listed, but only abnormal results are displayed) Labs Reviewed - No data to display  EKG None  Radiology DG Shoulder Right Portable  Result Date: 11/05/2019 CLINICAL DATA:  Injury, pain. Additional provided: Pain to top of shoulder and neck from work injury on Friday pulling trash, limited range of motion. EXAM: PORTABLE RIGHT SHOULDER COMPARISON:  No pertinent prior exams are available for comparison. FINDINGS: There is normal bony alignment. No evidence of acute osseous or articular abnormality. Degenerative changes of the acromioclavicular joint. IMPRESSION: No evidence of acute osseous or articular abnormality. Degenerative changes of the acromioclavicular joint. Electronically Signed   By: Tuesday DO   On: 11/05/2019 08:00    Procedures Procedures (including critical care time)  Medications Ordered in ED Medications  oxyCODONE-acetaminophen (PERCOCET/ROXICET) 5-325 MG per tablet 1 tablet (1 tablet Oral Given 11/05/19 01/05/20)    ED Course  I have reviewed the triage vital signs and the nursing notes.  Pertinent labs & imaging results that were available during my care of the patient were reviewed by me and considered in my medical decision making (see chart for details).    MDM Rules/Calculators/A&P                          Patient with a painful right shoulder.  She has a  soft tissue injury to her right shoulder.  She will be put in a sling given pain medicines and follow-up with orthopedics       This patient presents to the ED for concern of shoulder pain, this involves an extensive number of treatment options, and is a complaint that carries with it a high risk of complications and morbidity.  The differential diagnosis includes shoulder strain   Lab Tests:     Medicines ordered:   I ordered medication narcotics for pain  Imaging Studies ordered:   I ordered imaging studies which included  right shoulder and  I independently visualized and interpreted imaging which showed negative  Additional history obtained:   Additional history obtained from record  Previous records obtained and reviewed.  Consultations Obtained:   Reevaluation:  After the interventions stated above, I reevaluated the patient and found improved  Critical Interventions:  .   Final Clinical Impression(s) / ED Diagnoses Final diagnoses:  Acute pain of right shoulder    Rx / DC Orders ED Discharge Orders         Ordered    oxyCODONE-acetaminophen (PERCOCET/ROXICET) 5-325 MG tablet  Every 6 hours PRN     Discontinue  Reprint     11/05/19 8677           Bethann Berkshire, MD 11/07/19 1056

## 2019-11-20 ENCOUNTER — Ambulatory Visit (INDEPENDENT_AMBULATORY_CARE_PROVIDER_SITE_OTHER): Payer: Self-pay | Admitting: Orthopedic Surgery

## 2019-11-20 ENCOUNTER — Other Ambulatory Visit: Payer: Self-pay

## 2019-11-20 ENCOUNTER — Encounter: Payer: Self-pay | Admitting: Orthopedic Surgery

## 2019-11-20 VITALS — BP 136/83 | HR 91 | Ht 62.0 in | Wt 208.0 lb

## 2019-11-20 DIAGNOSIS — S46211A Strain of muscle, fascia and tendon of other parts of biceps, right arm, initial encounter: Secondary | ICD-10-CM

## 2019-11-20 NOTE — Progress Notes (Signed)
NEW PROBLEM//OFFICE VISIT  Chief Complaint  Patient presents with  . Shoulder Pain    NEW PT/AP ER/RT SHOULDER PAIN/HAD XR's 11/05/19, Picked up trash at work on 11/03/2019    59 years old evaluated for right shoulder pain  Pain started acutely after lifting a garbage bag out of a garbage can.  She felt acute pain over the right deltoid and proximal humerus she did go to the ER x-rays were negative she was placed in a sling she is here for follow-up after 2 weeks it seems things are getting better   Review of Systems  Constitutional: Negative.   Neurological: Negative.      Past Medical History:  Diagnosis Date  . GERD (gastroesophageal reflux disease)   . Thyroid disease     No past surgical history on file.  Family History  Problem Relation Age of Onset  . Diabetes Mother   . Collagen disease Maternal Grandmother   . Collagen disease Other   . Broken bones Neg Hx    Social History   Tobacco Use  . Smoking status: Former Smoker    Types: Cigarettes  . Smokeless tobacco: Never Used  Substance Use Topics  . Alcohol use: Yes    Comment: Rarely once a year  . Drug use: No    No Known Allergies  Current Meds  Medication Sig  . levothyroxine (SYNTHROID) 88 MCG tablet Take 1 tablet by mouth once daily  . meloxicam (MOBIC) 15 MG tablet Take 1 tablet (15 mg total) by mouth daily.  . traMADol (ULTRAM) 50 MG tablet Take 1 tablet (50 mg total) by mouth daily as needed.    BP 136/83   Pulse 91   Ht 5\' 2"  (1.575 m)   Wt 208 lb (94.3 kg)   LMP 08/12/2010   BMI 38.04 kg/m   Physical Exam Constitutional:      General: She is not in acute distress.    Appearance: She is well-developed.  Cardiovascular:     Comments: No peripheral edema Skin:    General: Skin is warm and dry.  Neurological:     Mental Status: She is alert and oriented to person, place, and time.     Sensory: No sensory deficit.     Coordination: Coordination normal.     Gait: Gait normal.      Deep Tendon Reflexes: Reflexes are normal and symmetric.     Ortho Exam  Normal range of motion right shoulder mild tenderness right deltoid and proximal humerus near the biceps tendon and bicipital groove strength and stability normal neurovascular exam intact  MEDICAL DECISION MAKING  A.  Encounter Diagnosis  Name Primary?  . Rupture of right proximal biceps tendon, initial encounter Yes    B. DATA ANALYSED:   IMAGING: Interpretation of images: Shoulder x-rays AP lateral I have seen and they are negative for acute fracture Orders: Negative outside records reviewed: ER  C. MANAGEMENT   Symptomatic treatment  No orders of the defined types were placed in this encounter.     08/14/2010, MD  11/20/2019 2:46 PM

## 2019-11-20 NOTE — Patient Instructions (Addendum)
Use ice or heat and tylenol or ibuprofen for discomfort   Should resolve in 6 weeks

## 2019-12-20 ENCOUNTER — Other Ambulatory Visit: Payer: Self-pay | Admitting: Family Medicine

## 2019-12-20 DIAGNOSIS — G894 Chronic pain syndrome: Secondary | ICD-10-CM

## 2019-12-22 NOTE — Telephone Encounter (Signed)
Last visit in 07/2019 - not due for f/u until 11/21.

## 2019-12-26 NOTE — Telephone Encounter (Signed)
It seems like she has another refill available. Noted that she filled an opioid medication prescribed by another provider, which is not allowed when there is a med contract. Thanks, BJ

## 2019-12-27 NOTE — Telephone Encounter (Signed)
Refill has expired, other rx was given by ED due to shoulder pain. Pt f/u with ortho & rx was d/c'd.

## 2019-12-29 NOTE — Telephone Encounter (Signed)
Rx sent. Please remind her she can not fill controlled med prescribed by other providers. Thanks, BJ

## 2020-04-02 ENCOUNTER — Telehealth (INDEPENDENT_AMBULATORY_CARE_PROVIDER_SITE_OTHER): Payer: Self-pay | Admitting: Family Medicine

## 2020-04-02 ENCOUNTER — Encounter: Payer: Self-pay | Admitting: Family Medicine

## 2020-04-02 DIAGNOSIS — J069 Acute upper respiratory infection, unspecified: Secondary | ICD-10-CM

## 2020-04-02 MED ORDER — HYDROCODONE-HOMATROPINE 5-1.5 MG/5ML PO SYRP: 5.0000 mL | ORAL_SOLUTION | Freq: Four times a day (QID) | ORAL | 0 refills | Status: AC | PRN

## 2020-04-02 NOTE — Progress Notes (Signed)
Patient ID: Julie Perry, female   DOB: 03/02/1961, 60 y.o.   MRN: [id number]  This visit type was conducted due to national recommendations for restrictions regarding the COVID-19 pandemic in an effort to limit this patient's exposure and mitigate transmission in our community.   Virtual Visit via Telephone Note  I connected with Julie Perry on 04/02/20 at  1:45 PM EST by telephone and verified that I am speaking with the correct person using two identifiers.   I discussed the limitations, risks, security and privacy concerns of performing an evaluation and management service by telephone and the availability of in person appointments. I also discussed with the patient that there may be a patient responsible charge related to this service. The patient expressed understanding and agreed to proceed.  Location patient: home Location provider: work or home office Participants present for the call: patient, provider Patient did not have a visit in the prior 7 days to address this/these issue(s).   History of Present Illness: Julie Perry called with onset last Friday of nasal congestion and cough.  No fever.  No loss of taste or smell.  No significant body aches.  She states she feels generally fine now except for severe cough which has been interfering with sleep.  She tried over-the-counter medication such as Alka-Seltzer plus without relief.  She has not been Covid tested.  Does not have any significant dyspnea.  No known sick contacts.  Non-smoker.  Past Medical History:  Diagnosis Date  . GERD (gastroesophageal reflux disease)   . Thyroid disease    No past surgical history on file.  reports that she has quit smoking. Her smoking use included cigarettes. She has never used smokeless tobacco. She reports current alcohol use. She reports that she does not use drugs. family history includes Collagen disease in her maternal grandmother and another family member; Diabetes in her mother. No Known  Allergies    Observations/Objective: Patient sounds cheerful and well on the phone. I do not appreciate any SOB. Speech and thought processing are grossly intact. Patient reported vitals:  Assessment and Plan:  Upper respiratory infection with cough.  Suspect viral.  Patient in no respiratory distress.  Has not been Covid tested but has no dyspnea or fever  -We agreed to send in limited Hycodan cough syrup 1 teaspoon nightly as needed for severe cough.  She will try Sudafed for daytime use for nasal congestion and plenty fluids and rest. -We have recommend she try to get Covid tested if there is any question of increased shortness of breath or any fever -She plans to stay quarantined for the next few days  Follow Up Instructions:  -As above   [id number] 5-10 [id number] 11-20 99443 21-30 I did not refer this patient for an OV in the next 24 hours for this/these issue(s).  I discussed the assessment and treatment plan with the patient. The patient was provided an opportunity to ask questions and all were answered. The patient agreed with the plan and demonstrated an understanding of the instructions.   The patient was advised to call back or seek an in-person evaluation if the symptoms worsen or if the condition fails to improve as anticipated.  I provided 15 minutes of non-face-to-face time during this encounter.   Evelena Peat, MD

## 2020-04-19 ENCOUNTER — Encounter: Payer: Self-pay | Admitting: Family Medicine

## 2020-04-19 ENCOUNTER — Ambulatory Visit: Payer: Self-pay | Admitting: Family Medicine

## 2020-04-19 ENCOUNTER — Other Ambulatory Visit: Payer: Self-pay

## 2020-04-19 VITALS — BP 128/80 | HR 83 | Temp 98.1°F | Resp 16 | Ht 62.0 in | Wt 208.5 lb

## 2020-04-19 DIAGNOSIS — Z6838 Body mass index (BMI) 38.0-38.9, adult: Secondary | ICD-10-CM

## 2020-04-19 DIAGNOSIS — E039 Hypothyroidism, unspecified: Secondary | ICD-10-CM

## 2020-04-19 DIAGNOSIS — M5442 Lumbago with sciatica, left side: Secondary | ICD-10-CM

## 2020-04-19 DIAGNOSIS — M5441 Lumbago with sciatica, right side: Secondary | ICD-10-CM

## 2020-04-19 DIAGNOSIS — G8929 Other chronic pain: Secondary | ICD-10-CM

## 2020-04-19 NOTE — Progress Notes (Signed)
HPI: Julie Perry is a 60 y.o. female, who is here today for follow up.   She was last seen on 08/21/19. Chronic pain: Lower back pain, sometimes radiated to LLE. Negative for saddle anesthesia and bladder/bowel dysfunction.  Knee pain, L>R. She is on Tramadol 50 mg bid prn and Meloxicam 15 mg daily.  She got a prescription for Hycodan on 04/02/20 for cough. She did not have fever,anosmia,or ageusia. Cough has resolved.  Back pain is not as bad since she has a new mattress.  Her job involves a lot of walking,mopping,sweeping,and Public librarian. These activities exacerbated pain. Takes Tramadol mainly during days she works.  She is not taking Meloxicam 15 mg often, still has some at home.  Hypothyroidism: She is on Levothyroxine 88 mcg daily. She has not noted cold/heat intolerance,fatigue,palpitations,tremors.  Lab Results  Component Value Date   TSH 2.59 06/21/2019   Frustrates about not being able to lose wt. She does not exercise regularly. Active at work, 2-3 times per week. She doe snot think she eats a lot. Skips meals sometimes. Today she had 2 toasts,2 eggs,and 4 bacon strips for breakfast. Yesterday she got egg muffin from [employer], ate 1/2, and cereal.  She doe snot track calories.  Review of Systems  Constitutional: Negative for activity change, appetite change and fever.  HENT: Negative for mouth sores, nosebleeds and sore throat.   Respiratory: Negative for cough, shortness of breath and wheezing.   Cardiovascular: Negative for chest pain, palpitations and leg swelling.  Gastrointestinal: Negative for abdominal pain, nausea and vomiting.       Negative for changes in bowel habits.  Genitourinary: Negative for decreased urine volume, dysuria and hematuria.  Neurological: Negative for syncope, weakness and headaches.  Rest of ROS, see pertinent positives sand negatives in HPI  Current Outpatient Medications on File Prior to Visit  Medication Sig  Dispense Refill  . meloxicam (MOBIC) 15 MG tablet Take 1 tablet (15 mg total) by mouth daily. 90 tablet 2  . traMADol (ULTRAM) 50 MG tablet TAKE 2 TABLETS BY MOUTH DAILY AS NEEDED 60 tablet 2   No current facility-administered medications on file prior to visit.   Past Medical History:  Diagnosis Date  . GERD (gastroesophageal reflux disease)   . Thyroid disease    No Known Allergies  Social History   Socioeconomic History  . Marital status: Married    Spouse name: Not on file  . Number of children: Not on file  . Years of education: Not on file  . Highest education level: Not on file  Occupational History  . Not on file  Tobacco Use  . Smoking status: Former Smoker    Types: Cigarettes  . Smokeless tobacco: Never Used  Substance and Sexual Activity  . Alcohol use: Yes    Comment: Rarely once a year  . Drug use: No  . Sexual activity: Not on file  Other Topics Concern  . Not on file  Social History Narrative   Work or School: Engineer, technical sales Situation: lives with son (5 yo)      Spiritual Beliefs: Christian      Lifestyle: no regular exercise, diet is so so            Social Determinants of Corporate investment banker Strain: Not on file  Food Insecurity: Not on file  Transportation Needs: Not on file  Physical Activity: Not on  file  Stress: Not on file  Social Connections: Not on file    Vitals:   04/19/20 1516  BP: 128/80  Pulse: 83  Resp: 16  Temp: 98.1 F (36.7 C)  SpO2: 99%   Wt Readings from Last 3 Encounters:  04/19/20 208 lb 8 oz (94.6 kg)  11/20/19 208 lb (94.3 kg)  11/05/19 203 lb (92.1 kg)   Body mass index is 38.14 kg/m.  Physical Exam Vitals and nursing note reviewed.  Constitutional:      General: She is not in acute distress.    Appearance: She is well-developed.  HENT:     Head: Normocephalic and atraumatic.     Mouth/Throat:     Mouth: Oropharynx is clear and moist and mucous  membranes are normal. Mucous membranes are moist.  Eyes:     Conjunctiva/sclera: Conjunctivae normal.     Pupils: Pupils are equal, round, and reactive to light.  Cardiovascular:     Rate and Rhythm: Normal rate and regular rhythm.     Pulses:          Dorsalis pedis pulses are 2+ on the right side and 2+ on the left side.     Heart sounds: No murmur heard.   Pulmonary:     Effort: Pulmonary effort is normal. No respiratory distress.     Breath sounds: Normal breath sounds.  Abdominal:     Palpations: Abdomen is soft. There is no hepatomegaly or mass.     Tenderness: There is no abdominal tenderness.  Musculoskeletal:        General: No edema.     Lumbar back: No tenderness or bony tenderness. Negative right straight leg raise test and negative left straight leg raise test.  Lymphadenopathy:     Cervical: No cervical adenopathy.  Skin:    General: Skin is warm.     Findings: No erythema or rash.  Neurological:     Mental Status: She is alert and oriented to person, place, and time.     Cranial Nerves: No cranial nerve deficit.     Gait: Gait normal.     Deep Tendon Reflexes: Strength normal.  Psychiatric:        Mood and Affect: Mood and affect normal.     Comments: Well groomed, good eye contact.   ASSESSMENT AND PLAN:   Julie Perry was seen today for follow-up.  Orders Placed This Encounter  Procedures  . TSH  . Basic Metabolic Panel   Lab Results  Component Value Date   TSH 4.53 (H) 04/19/2020   Lab Results  Component Value Date   CREATININE 0.80 04/19/2020   BUN 23 04/19/2020   NA 139 04/19/2020   K 4.0 04/19/2020   CL 104 04/19/2020   CO2 25 04/19/2020   Chronic bilateral low back pain with bilateral sciatica Pain is adequately controlled. We discussed guidelines in regard to chronic pain management with opioid like medication. Medication contract 08/21/19. PDMP reviewed. Discussed some side effects of medications.  Acquired  hypothyroidism Continue Levothyroxine 88 mcg daily. Medication dose will be adjusted, if needed, according to TSH.  Class 2 severe obesity due to excess calories with serious comorbidity and body mass index (BMI) of 38.0 to 38.9 in adult [facility]) We discussed benefits of wt loss as well as adverse effects of obesity. Consistency with healthy diet and physical activity recommended. Showed her some apps she can use on her phone to track calories.   Return in about 5  months (around 09/17/2020).   Lovina Zuver G. [place], MD  [facility]. [place] office.    A few things to remember from today's visit:  Chronic bilateral low back pain with bilateral sciatica  Osteoarthritis of both knees, unspecified osteoarthritis type  Acquired hypothyroidism - Plan: Basic metabolic panel, TSH  If you need refills please call your pharmacy. Do not use My Chart to request refills or for acute issues that need immediate attention.   Calorie count recommend, you can use an appt. 1500 cal/day max.  Please be sure medication list is accurate. If a new problem present, please set up appointment sooner than planned today.

## 2020-04-19 NOTE — Patient Instructions (Signed)
A few things to remember from today's visit:  Chronic bilateral low back pain with bilateral sciatica  Osteoarthritis of both knees, unspecified osteoarthritis type  Acquired hypothyroidism - Plan: Basic metabolic panel, TSH  If you need refills please call your pharmacy. Do not use My Chart to request refills or for acute issues that need immediate attention.   Calorie count recommend, you can use an appt. 1500 cal/day max.  Please be sure medication list is accurate. If a new problem present, please set up appointment sooner than planned today.

## 2020-04-20 LAB — TSH: TSH: 4.53 mIU/L — ABNORMAL HIGH (ref 0.40–4.50)

## 2020-04-20 LAB — BASIC METABOLIC PANEL
BUN: 23 mg/dL (ref 7–25)
CO2: 25 mmol/L (ref 20–32)
Calcium: 9 mg/dL (ref 8.6–10.4)
Chloride: 104 mmol/L (ref 98–110)
Creat: 0.8 mg/dL (ref 0.50–0.99)
Glucose, Bld: 88 mg/dL (ref 65–99)
Potassium: 4 mmol/L (ref 3.5–5.3)
Sodium: 139 mmol/L (ref 135–146)

## 2020-04-20 MED ORDER — LEVOTHYROXINE SODIUM 88 MCG PO TABS: 88.0000 ug | ORAL_TABLET | Freq: Every day | ORAL | 2 refills | Status: DC

## 2020-08-28 ENCOUNTER — Other Ambulatory Visit: Payer: Self-pay | Admitting: Family Medicine

## 2020-08-28 DIAGNOSIS — G894 Chronic pain syndrome: Secondary | ICD-10-CM

## 2020-08-28 NOTE — Telephone Encounter (Signed)
Last filled 04/18/20, last OV 03/2020

## 2020-09-05 ENCOUNTER — Telehealth: Payer: Self-pay | Admitting: Family Medicine

## 2020-09-05 DIAGNOSIS — G894 Chronic pain syndrome: Secondary | ICD-10-CM

## 2020-09-06 NOTE — Telephone Encounter (Signed)
Patient is calling back to check the status of medication refill request for Tramadol, please advise. CB is [phone]

## 2020-09-06 NOTE — Telephone Encounter (Signed)
I called and spoke with pharmacy. They needed the diagnosis code, code given. Patient is aware that the Rx will be filled & ready for her to pick up today.

## 2020-10-09 ENCOUNTER — Encounter: Payer: Self-pay | Admitting: Family Medicine

## 2020-10-09 ENCOUNTER — Telehealth (INDEPENDENT_AMBULATORY_CARE_PROVIDER_SITE_OTHER): Payer: Self-pay | Admitting: Family Medicine

## 2020-10-09 VITALS — Ht 62.0 in

## 2020-10-09 DIAGNOSIS — Z5329 Procedure and treatment not carried out because of patient's decision for other reasons: Secondary | ICD-10-CM

## 2020-10-09 DIAGNOSIS — Z91199 Patient's noncompliance with other medical treatment and regimen due to unspecified reason: Secondary | ICD-10-CM

## 2020-10-31 ENCOUNTER — Other Ambulatory Visit: Payer: Self-pay

## 2020-10-31 ENCOUNTER — Emergency Department (HOSPITAL_COMMUNITY)
Admission: EM | Admit: 2020-10-31 | Discharge: 2020-10-31 | Disposition: A | Discharge status: Home / Self Care | Payer: Self-pay | Attending: Emergency Medicine | Admitting: Emergency Medicine

## 2020-10-31 ENCOUNTER — Encounter (HOSPITAL_COMMUNITY): Payer: Self-pay | Admitting: Emergency Medicine

## 2020-10-31 DIAGNOSIS — Z79899 Other long term (current) drug therapy: Secondary | ICD-10-CM | POA: Insufficient documentation

## 2020-10-31 DIAGNOSIS — X500XXA Overexertion from strenuous movement or load, initial encounter: Secondary | ICD-10-CM | POA: Insufficient documentation

## 2020-10-31 DIAGNOSIS — E039 Hypothyroidism, unspecified: Secondary | ICD-10-CM | POA: Insufficient documentation

## 2020-10-31 DIAGNOSIS — Z87891 Personal history of nicotine dependence: Secondary | ICD-10-CM | POA: Insufficient documentation

## 2020-10-31 DIAGNOSIS — Y990 Civilian activity done for income or pay: Secondary | ICD-10-CM | POA: Insufficient documentation

## 2020-10-31 DIAGNOSIS — M545 Low back pain, unspecified: Secondary | ICD-10-CM | POA: Insufficient documentation

## 2020-10-31 MED ORDER — PREDNISONE 20 MG PO TABS
ORAL_TABLET | ORAL | 0 refills | Status: DC
Start: 2020-10-31 — End: 2021-01-14

## 2020-10-31 MED ORDER — METHOCARBAMOL 750 MG PO TABS
750.0000 mg | ORAL_TABLET | Freq: Three times a day (TID) | ORAL | 0 refills | Status: DC | PRN
Start: 2020-10-31 — End: 2021-01-14

## 2020-10-31 MED ORDER — ACETAMINOPHEN 500 MG PO TABS: 1000.0000 mg | ORAL_TABLET | Freq: Once | ORAL | Status: DC

## 2020-10-31 MED ORDER — IBUPROFEN 400 MG PO TABS: 400.0000 mg | ORAL_TABLET | Freq: Once | ORAL | Status: DC

## 2020-10-31 NOTE — ED Notes (Signed)
Pt verbalized she feels as if the pain is sciatica in her lower right back just, like her left side previous had. Pt would like a cortisone shot, stating her PCP gave her one for her left side and it was effective.

## 2020-10-31 NOTE — ED Provider Notes (Signed)
[facility] EMERGENCY DEPARTMENT Provider Note   CSN: [id number] Arrival date & time: 10/31/20  4270     History Chief Complaint  Patient presents with   Back Pain    Julie Perry is a 60 y.o. female.  Pt c/o right low back pain that occasionally radiates towards right buttock. Symptoms acute onset in past 1-2 weeks, moderate, dull, worse w certain movements/bending. Denies saddle area or leg numbness. No weakness or loss of normal functional ability. No problems w normal bowel and bladder function. Denies fever or chills. No specific injury or strain. Work does involve bending and lifting. No anterior pain - no abd or pelvic pain. No hematuria or dysuria. Has tried ultram at home without relief.   The history is provided by the patient.  Back Pain Associated symptoms: no abdominal pain, no chest pain, no dysuria, no fever, no numbness, no pelvic pain and no weakness       Past Medical History:  Diagnosis Date   GERD (gastroesophageal reflux disease)    Thyroid disease     Patient Active Problem List   Diagnosis Date Noted   Constipation 08/21/2019   Chronic pain disorder 04/20/2018   Insomnia 04/20/2018   Osteoarthritis of both knees 01/14/2017   Unilateral primary osteoarthritis, right knee 01/14/2017   Osteoarthritis of facet joint of lumbar spine 03/16/2016   Class 2 obesity with body mass index (BMI) of 38.0 to 38.9 in adult 06/22/2013   FATIGUE 01/22/2009   Hypothyroidism 01/20/2007   GERD 01/20/2007    History reviewed. No pertinent surgical history.   OB History   No obstetric history on file.     Family History  Problem Relation Age of Onset   Diabetes Mother    Collagen disease Maternal Grandmother    Collagen disease Other    Broken bones Neg Hx     Social History   Tobacco Use   Smoking status: Former    Types: Cigarettes   Smokeless tobacco: Never  Substance Use Topics   Alcohol use: Yes    Comment: Rarely once a year   Drug use: No     Home Medications Prior to Admission medications   Medication Sig Start Date End Date Taking? Authorizing Provider  levothyroxine (SYNTHROID) 88 MCG tablet Take 1 tablet (88 mcg total) by mouth daily. 04/20/20   [place], Betty G, MD  meloxicam (MOBIC) 15 MG tablet Take 1 tablet (15 mg total) by mouth daily. 06/21/19   [place], Betty G, MD  traMADol Janean Sark) 50 MG tablet TAKE 2 TABLETS BY MOUTH ONCE DAILY AS NEEDED 09/02/20   [place], Betty G, MD    Allergies    Patient has no known allergies.  Review of Systems   Review of Systems  Constitutional:  Negative for fever.  Respiratory:  Negative for shortness of breath.   Cardiovascular:  Negative for chest pain.  Gastrointestinal:  Negative for abdominal pain.  Genitourinary:  Negative for dysuria, hematuria and pelvic pain.  Musculoskeletal:  Positive for back pain.  Neurological:  Negative for weakness and numbness.   Physical Exam Updated Vital Signs BP 114/69   Pulse 75   Temp 99.1 F (37.3 C) (Oral)   Resp 16   Ht 1.575 m (5\' 2" )   Wt 90.7 kg   LMP 08/12/2010   SpO2 94%   BMI 36.58 kg/m   Physical Exam Vitals and nursing note reviewed.  Constitutional:      Appearance: Normal appearance. She is well-developed.  HENT:  Head: Atraumatic.     Nose: Nose normal.     Mouth/Throat:     Mouth: Mucous membranes are moist.  Eyes:     General: No scleral icterus.    Conjunctiva/sclera: Conjunctivae normal.  Neck:     Trachea: No tracheal deviation.  Cardiovascular:     Rate and Rhythm: Normal rate.     Pulses: Normal pulses.  Pulmonary:     Effort: Pulmonary effort is normal. No respiratory distress.  Abdominal:     General: There is no distension.  Genitourinary:    Comments: No cva tenderness.  Musculoskeletal:        General: No swelling.     Cervical back: Neck supple. No muscular tenderness.     Right lower leg: No edema.     Left lower leg: No edema.     Comments: T/L/S spine aligned, non tender. Right  lumbar muscular tenderness. No sts to area. No skin changes, lesions or erythema to area of pain.   Skin:    General: Skin is warm and dry.     Findings: No rash.  Neurological:     Mental Status: She is alert.     Comments: Alert, speech normal. Motor intact bil LE, stre 5/5. Sens grossly intact. Steady gait.  Psychiatric:        Mood and Affect: Mood normal.    ED Results / Procedures / Treatments   Labs (all labs ordered are listed, but only abnormal results are displayed) Labs Reviewed - No data to display  EKG None  Radiology No results found.  Procedures Procedures   Medications Ordered in ED Medications - No data to display  ED Course  I have reviewed the triage vital signs and the nursing notes.  Pertinent labs & imaging results that were available during my care of the patient were reviewed by me and considered in my medical decision making (see chart for details).    MDM Rules/Calculators/A&P                           Reviewed nursing notes and prior charts for additional history. Prior imaging studies reviewed - mild degenerative changes then.   Pt drove self to ED.  Acetaminophen po. Ibuprofen po.   Rx prednisone, robaxin for home.  Rec pcp f/u.   Final Clinical Impression(s) / ED Diagnoses Final diagnoses:  None    Rx / DC Orders ED Discharge Orders     None        Cathren Laine, MD 10/31/20 [phone]

## 2020-10-31 NOTE — Discharge Instructions (Addendum)
It was our pleasure to provide your ER care today - we hope that you feel better.  Avoid bending at waist or heavy lifting > 20 lbs for the next 3-4 days.  Try heat therapy and/or gentle massage to sore area.   Take prednisone as prescribed. Take acetaminophen as need. You may also take robaxin as need for muscle pain/spasm - no driving when taking.   Follow up with primary care doctor in 1-2 weeks if symptoms fail to improve/resolve.  Return to ER if worse, new symptoms, high fevers, intractable pain, numbness/weakness, problems w bladder function, or other concern.

## 2020-10-31 NOTE — ED Triage Notes (Signed)
Pt to the ED with back pain x 2wks

## 2020-11-26 ENCOUNTER — Other Ambulatory Visit: Payer: Self-pay | Admitting: Family Medicine

## 2020-11-26 DIAGNOSIS — M170 Bilateral primary osteoarthritis of knee: Secondary | ICD-10-CM

## 2020-12-26 ENCOUNTER — Telehealth: Payer: Self-pay | Admitting: Family Medicine

## 2020-12-26 DIAGNOSIS — E039 Hypothyroidism, unspecified: Secondary | ICD-10-CM

## 2020-12-26 NOTE — Telephone Encounter (Signed)
Julie Perry from  [facility] call and stated she want to know can she change the manufacturer on this medication Levothyroxine.she stated you can call her back at [phone].

## 2020-12-27 ENCOUNTER — Other Ambulatory Visit: Payer: Self-pay | Admitting: Family Medicine

## 2020-12-27 DIAGNOSIS — G894 Chronic pain syndrome: Secondary | ICD-10-CM

## 2020-12-27 NOTE — Telephone Encounter (Signed)
Last filled 09/06/20 Last ov 04/19/20

## 2020-12-30 NOTE — Telephone Encounter (Signed)
Spoke with pharmacy, okay given to change manufacturer.

## 2020-12-30 NOTE — Telephone Encounter (Signed)
I left patient a voicemail to call the office back to schedule a lab appointment in 6-8 weeks for a recheck on her TSH due to a manufacturer change.

## 2020-12-30 NOTE — Telephone Encounter (Signed)
It is ok to change medication manufacturer but TSH needs to be done 6-8 weeks after starting new generic. Thanks, BJ

## 2020-12-30 NOTE — Addendum Note (Signed)
Addended by: Weyman Croon E on: 12/30/2020 12:04 PM   Modules accepted: Orders

## 2021-01-01 ENCOUNTER — Other Ambulatory Visit: Payer: Self-pay

## 2021-01-01 DIAGNOSIS — G894 Chronic pain syndrome: Secondary | ICD-10-CM

## 2021-01-01 NOTE — Telephone Encounter (Signed)
Patient called requesting Rx refill  traMADol (ULTRAM) 50 MG tablet

## 2021-01-01 NOTE — Telephone Encounter (Signed)
Needs appt with pcp

## 2021-01-02 NOTE — Telephone Encounter (Signed)
Patient called back and was informed to schedule appt to receive more refills pt has appt scheduled on 10/18 Ov and lab.

## 2021-01-03 NOTE — Addendum Note (Signed)
Addended by: Kathreen Devoid on: 01/03/2021 07:47 AM   Modules accepted: Orders

## 2021-01-07 ENCOUNTER — Other Ambulatory Visit: Payer: Self-pay

## 2021-01-07 DIAGNOSIS — E039 Hypothyroidism, unspecified: Secondary | ICD-10-CM

## 2021-01-07 MED ORDER — LEVOTHYROXINE SODIUM 88 MCG PO TABS: 88.0000 ug | ORAL_TABLET | Freq: Every day | ORAL | 2 refills | Status: DC

## 2021-01-07 NOTE — Progress Notes (Signed)
Rx request came in via fax for Levothyroxine. Rx refilled electronically based off pt last TSH results.

## 2021-01-14 ENCOUNTER — Ambulatory Visit (INDEPENDENT_AMBULATORY_CARE_PROVIDER_SITE_OTHER): Payer: Self-pay | Admitting: Family Medicine

## 2021-01-14 ENCOUNTER — Other Ambulatory Visit (INDEPENDENT_AMBULATORY_CARE_PROVIDER_SITE_OTHER): Payer: Self-pay

## 2021-01-14 ENCOUNTER — Other Ambulatory Visit: Payer: Self-pay

## 2021-01-14 ENCOUNTER — Encounter: Payer: Self-pay | Admitting: Family Medicine

## 2021-01-14 VITALS — BP 124/70 | HR 93 | Ht 62.0 in | Wt 198.0 lb

## 2021-01-14 DIAGNOSIS — E039 Hypothyroidism, unspecified: Secondary | ICD-10-CM

## 2021-01-14 DIAGNOSIS — M5441 Lumbago with sciatica, right side: Secondary | ICD-10-CM

## 2021-01-14 DIAGNOSIS — R350 Frequency of micturition: Secondary | ICD-10-CM

## 2021-01-14 DIAGNOSIS — G894 Chronic pain syndrome: Secondary | ICD-10-CM

## 2021-01-14 DIAGNOSIS — G8929 Other chronic pain: Secondary | ICD-10-CM

## 2021-01-14 DIAGNOSIS — M5442 Lumbago with sciatica, left side: Secondary | ICD-10-CM

## 2021-01-14 DIAGNOSIS — M170 Bilateral primary osteoarthritis of knee: Secondary | ICD-10-CM

## 2021-01-14 LAB — TSH: TSH: 6.21 u[IU]/mL — ABNORMAL HIGH (ref 0.35–5.50)

## 2021-01-14 MED ORDER — TRAMADOL HCL 50 MG PO TABS: ORAL_TABLET | ORAL | 3 refills | Status: DC

## 2021-01-14 MED ORDER — MELOXICAM 15 MG PO TABS: 15.0000 mg | ORAL_TABLET | Freq: Every day | ORAL | 0 refills | Status: DC

## 2021-01-14 NOTE — Patient Instructions (Addendum)
A few things to remember from today's visit:  Chronic pain disorder - Plan: traMADol (ULTRAM) 50 MG tablet  Acquired hypothyroidism  Chronic bilateral low back pain with bilateral sciatica  Osteoarthritis of both knees, unspecified osteoarthritis type - Plan: meloxicam (MOBIC) 15 MG tablet  Urinary frequency - Plan: Urinalysis, Routine w reflex microscopic  If you need refills please call your pharmacy. Do not use My Chart to request refills or for acute issues that need immediate attention.   Miralax daily as needed. No changes in rest.  Please be sure medication list is accurate. If a new problem present, please set up appointment sooner than planned today.

## 2021-01-14 NOTE — Progress Notes (Signed)
HPI: [name] is a 60 y.o. female, who is here today for medication follow up.   She was last seen on 04/19/20.  Since her last visit she has been in the ED due to worsening lower back pain, 10/31/2020. Chronic lower back pain, sometimes radiated to left lower extremity. Negative for saddle anesthesia and bladder/bowel dysfunction.  Bilateral knee osteoarthritis, exacerbated by prolonged standing and walking.  Currently she is on tramadol 50 mg twice daily as needed and meloxicam 15 mg daily. She has tolerated medication well.  Usually back pain is exacerbated by activities at work: Walking, mopping, and sweeping. She is working at the [employer]. Back pain is back to her baseline. She is working 4 times per week.  Last Tramadol Rx filled on 09/06/20.  Hypothyroidism: Currently she is on levothyroxine 88 mcg daily. Tolerating medication well, no side effects reported. She has not noted dysphagia, palpitations, abdominal pain, changes in bowel habits (hx of constipation), tremor, cold/heat intolerance, or abnormal weight loss.  Lab Results  Component Value Date   TSH 4.53 (H) 04/19/2020   + Fatigue. Nocturia x 5 sometimes. +Urgency. These symptoms have been going on for a while.  She does not sleep well. She has tried Melatonin, has not helped.  Review of Systems  Constitutional:  Negative for chills and fever.  Respiratory:  Negative for cough, shortness of breath and wheezing.   Cardiovascular:  Negative for chest pain and leg swelling.  Genitourinary:  Negative for decreased urine volume, dysuria and hematuria.  Neurological:  Negative for syncope, weakness and headaches.  Psychiatric/Behavioral:  Positive for sleep disturbance. Negative for confusion.   Rest of ROS, see pertinent positives sand negatives in HPI  Current Outpatient Medications on File Prior to Visit  Medication Sig Dispense Refill   levothyroxine (SYNTHROID) 88 MCG tablet Take 1 tablet  (88 mcg total) by mouth daily. 90 tablet 2   No current facility-administered medications on file prior to visit.   Past Medical History:  Diagnosis Date   GERD (gastroesophageal reflux disease)    Thyroid disease    No Known Allergies  Social History   Socioeconomic History   Marital status: Married    Spouse name: Not on file   Number of children: Not on file   Years of education: Not on file   Highest education level: Not on file  Occupational History   Not on file  Tobacco Use   Smoking status: Former    Types: Cigarettes   Smokeless tobacco: Never  Substance and Sexual Activity   Alcohol use: Yes    Comment: Rarely once a year   Drug use: No   Sexual activity: Not on file  Other Topics Concern   Not on file  Social History Narrative   Work or School: Engineer, 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  Physical Exam Vitals and nursing note reviewed.  Constitutional:      General: She is not in acute distress.    Appearance: She is well-developed.  HENT:     Head: Normocephalic and atraumatic.     Mouth/Throat:     Mouth: Mucous membranes are moist.     Pharynx: Oropharynx is clear.  Eyes:     Conjunctiva/sclera: Conjunctivae normal.  Neck:     Thyroid: Thyromegaly present.  Cardiovascular:     Rate and Rhythm: Normal rate and regular rhythm.     Pulses:           Dorsalis pedis pulses are 2+ on the right side and 2+ on the left side.     Heart sounds: No murmur heard. Pulmonary:     Effort: Pulmonary effort is normal. No respiratory distress.     Breath sounds: Normal breath sounds.  Abdominal:     Palpations: Abdomen is soft. There is no hepatomegaly or mass.     Tenderness: There is no abdominal tenderness.  Lymphadenopathy:     Cervical: No cervical adenopathy.  Skin:    General: Skin is warm.     Findings: No erythema or rash.  Neurological:     General: No focal deficit present.     Mental Status: She is alert and oriented to person, place, and time.     Cranial Nerves: No cranial nerve deficit.     Gait: Gait normal.  Psychiatric:     Comments: Well groomed, good eye contact.   ASSESSMENT AND PLAN:  [name] was seen today for medication follow-up.  Orders Placed This Encounter  Procedures   Urinalysis, Routine w reflex microscopic   Chronic bilateral low back pain with bilateral sciatica Back pain is at her baseline now. Continue Tramadol 50 mg bid and Meloxicam 15 mg dailuy. We discussed some side effects of medications. Wt loss will help and recommended.  Chronic pain disorder Med contract signed today. [place] controlled substance report reviewed.  -     traMADol (ULTRAM) 50 MG tablet; TAKE 2 TABLETS BY MOUTH ONCE DAILY AS NEEDED  Acquired hypothyroidism Last TSH mildly abnormal. Enlarged thyroid. She does not have health insurance,so prefers to hold on imaging.She is trying to get Medicaid. Continue Levothyroxine 88 mcg daily. Further recommendations according to TSH result.  Osteoarthritis of both knees, unspecified osteoarthritis type Continue Meloxicam 15 mg daily prn. We discussed some side effects, including CV.  -     meloxicam (MOBIC) 15 MG tablet; Take 1 tablet (15 mg total) by mouth daily.  Urinary frequency We discussed possible etiologies, ?overactive bladder. We discussed pharmacologic options,  most can aggravate constipation. Further recommendations according to UA result.  She is overdue for mammogram,pap smear, and colonoscopy.She does not want these to be done until she has insurance.  Return in about 5 months (around 06/14/2021).   Kynzie Polgar G. [place], MD  [facility]. [place] office.

## 2021-01-15 NOTE — Progress Notes (Signed)
No show

## 2021-01-16 ENCOUNTER — Encounter: Payer: Self-pay | Admitting: Family Medicine

## 2021-01-16 MED ORDER — LEVOTHYROXINE SODIUM 88 MCG PO TABS: ORAL_TABLET | ORAL | 2 refills | Status: DC

## 2021-01-17 NOTE — Progress Notes (Signed)
I left a voicemail for patient to return my call.

## 2021-01-20 NOTE — Progress Notes (Signed)
I left a voicemail for patient to return my call.

## 2021-03-04 ENCOUNTER — Other Ambulatory Visit: Payer: Self-pay

## 2021-03-06 ENCOUNTER — Telehealth: Payer: Self-pay

## 2021-03-06 NOTE — Telephone Encounter (Signed)
---  Caller has had a cough, HA, looser stools/diarrhea after eating, congestion/stuffy nose, food beginning to taste bad, & vomiting since Monday evening. She has tested positive for Covid today. She had cancelled her bloodwork appt. on Monday and has an upcoming appt. rescheduled for Tuesday the 13th. She wants to know if she needs to reschedule it again due to her having Covid.  03/06/2021 12:15:00 PM [facility], RN, Amy

## 2021-03-11 ENCOUNTER — Other Ambulatory Visit (INDEPENDENT_AMBULATORY_CARE_PROVIDER_SITE_OTHER): Payer: Self-pay

## 2021-03-11 DIAGNOSIS — E039 Hypothyroidism, unspecified: Secondary | ICD-10-CM

## 2021-03-11 LAB — TSH: TSH: 1.05 u[IU]/mL (ref 0.35–5.50)

## 2021-03-11 LAB — T4, FREE: Free T4: 1.16 ng/dL (ref 0.60–1.60)

## 2021-03-12 ENCOUNTER — Other Ambulatory Visit: Payer: Self-pay | Admitting: Family Medicine

## 2021-03-12 ENCOUNTER — Other Ambulatory Visit: Payer: Self-pay

## 2021-03-12 DIAGNOSIS — E039 Hypothyroidism, unspecified: Secondary | ICD-10-CM

## 2021-03-12 MED ORDER — LEVOTHYROXINE SODIUM 88 MCG PO TABS: ORAL_TABLET | ORAL | 2 refills | Status: DC

## 2021-05-04 IMAGING — DX DG SHOULDER 2+V PORT*R*
2 series · 2 of 2 positions shown · non-contrast
Comparison: No pertinent prior exams are available for comparison.

CLINICAL DATA: Injury, pain. Additional provided: Pain to top of
shoulder and neck from work injury on [REDACTED] pulling trash, limited
range of motion.

EXAM:
PORTABLE RIGHT SHOULDER

[shoulder ap]
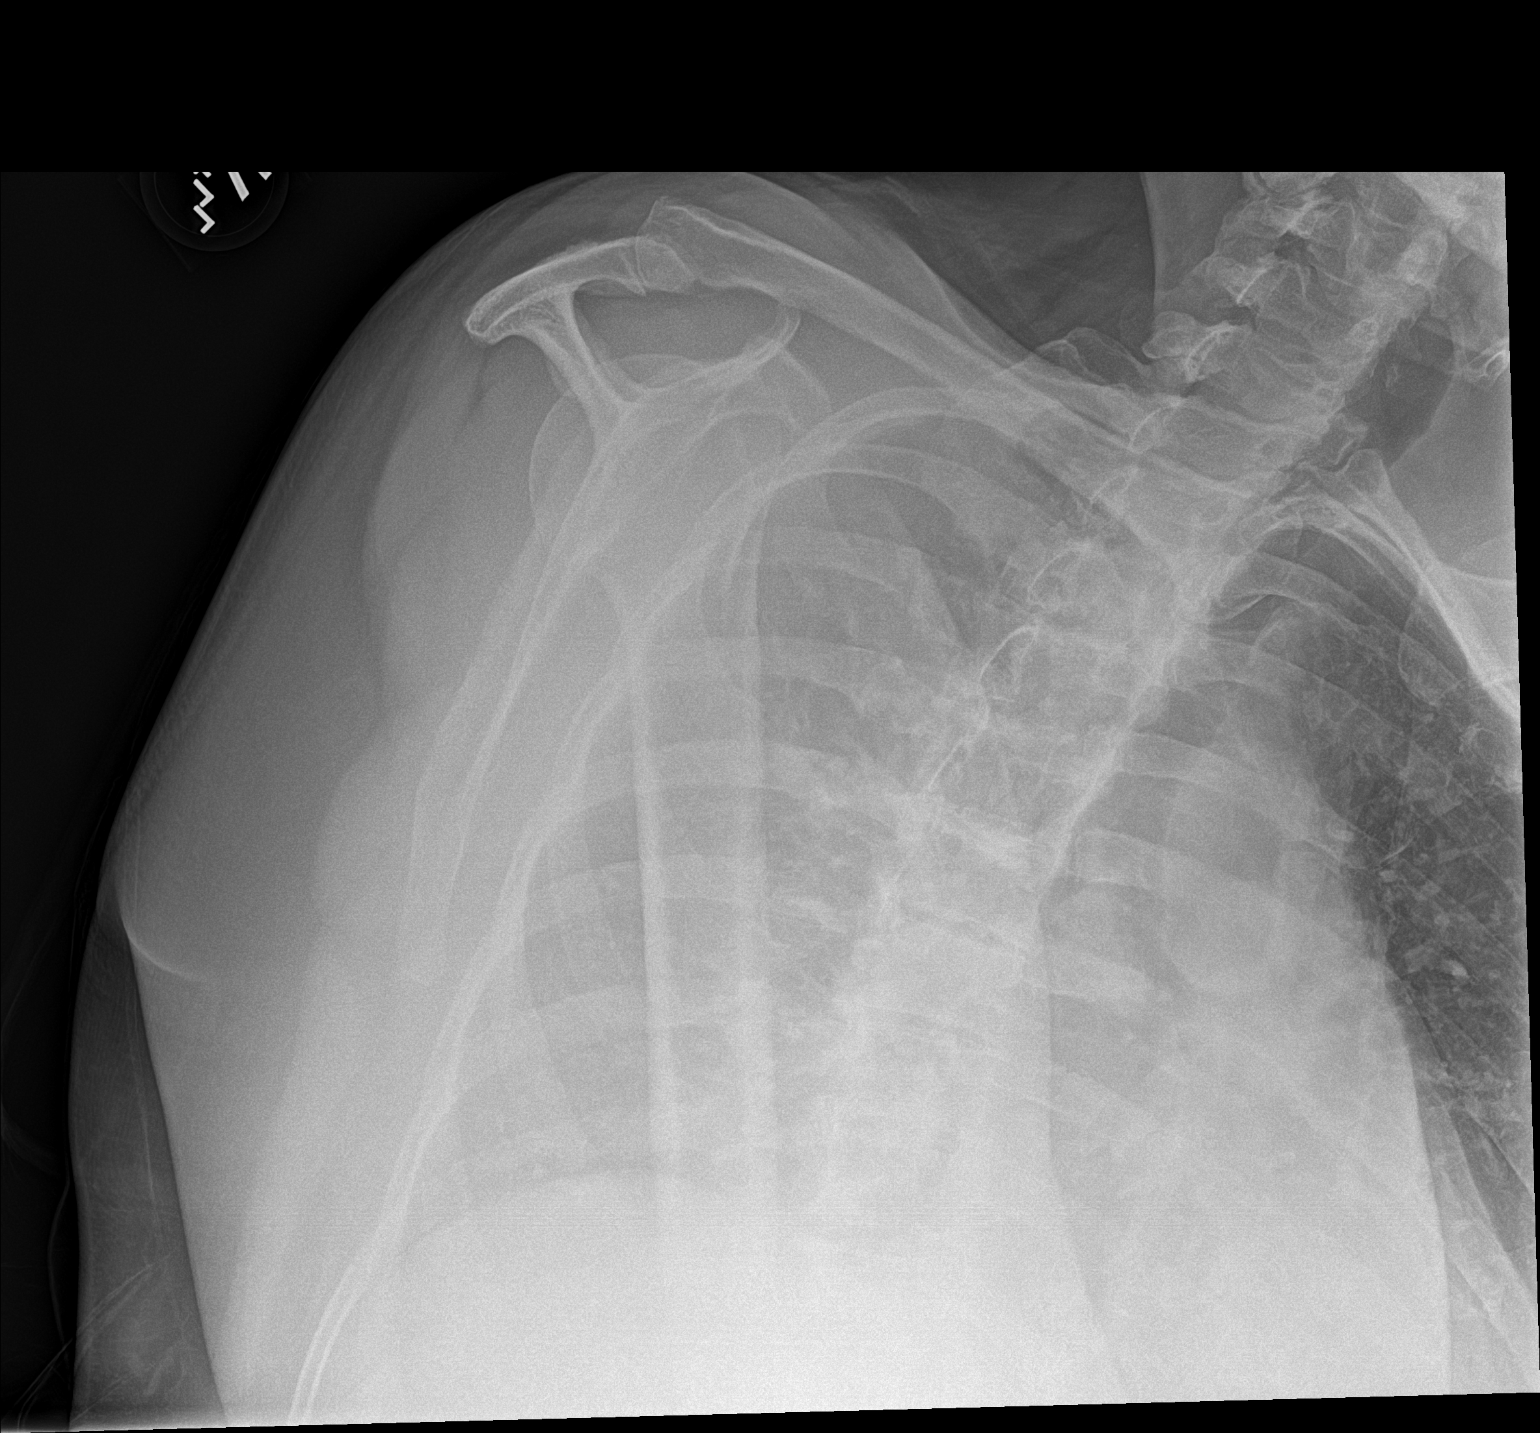

[shoulder axial]
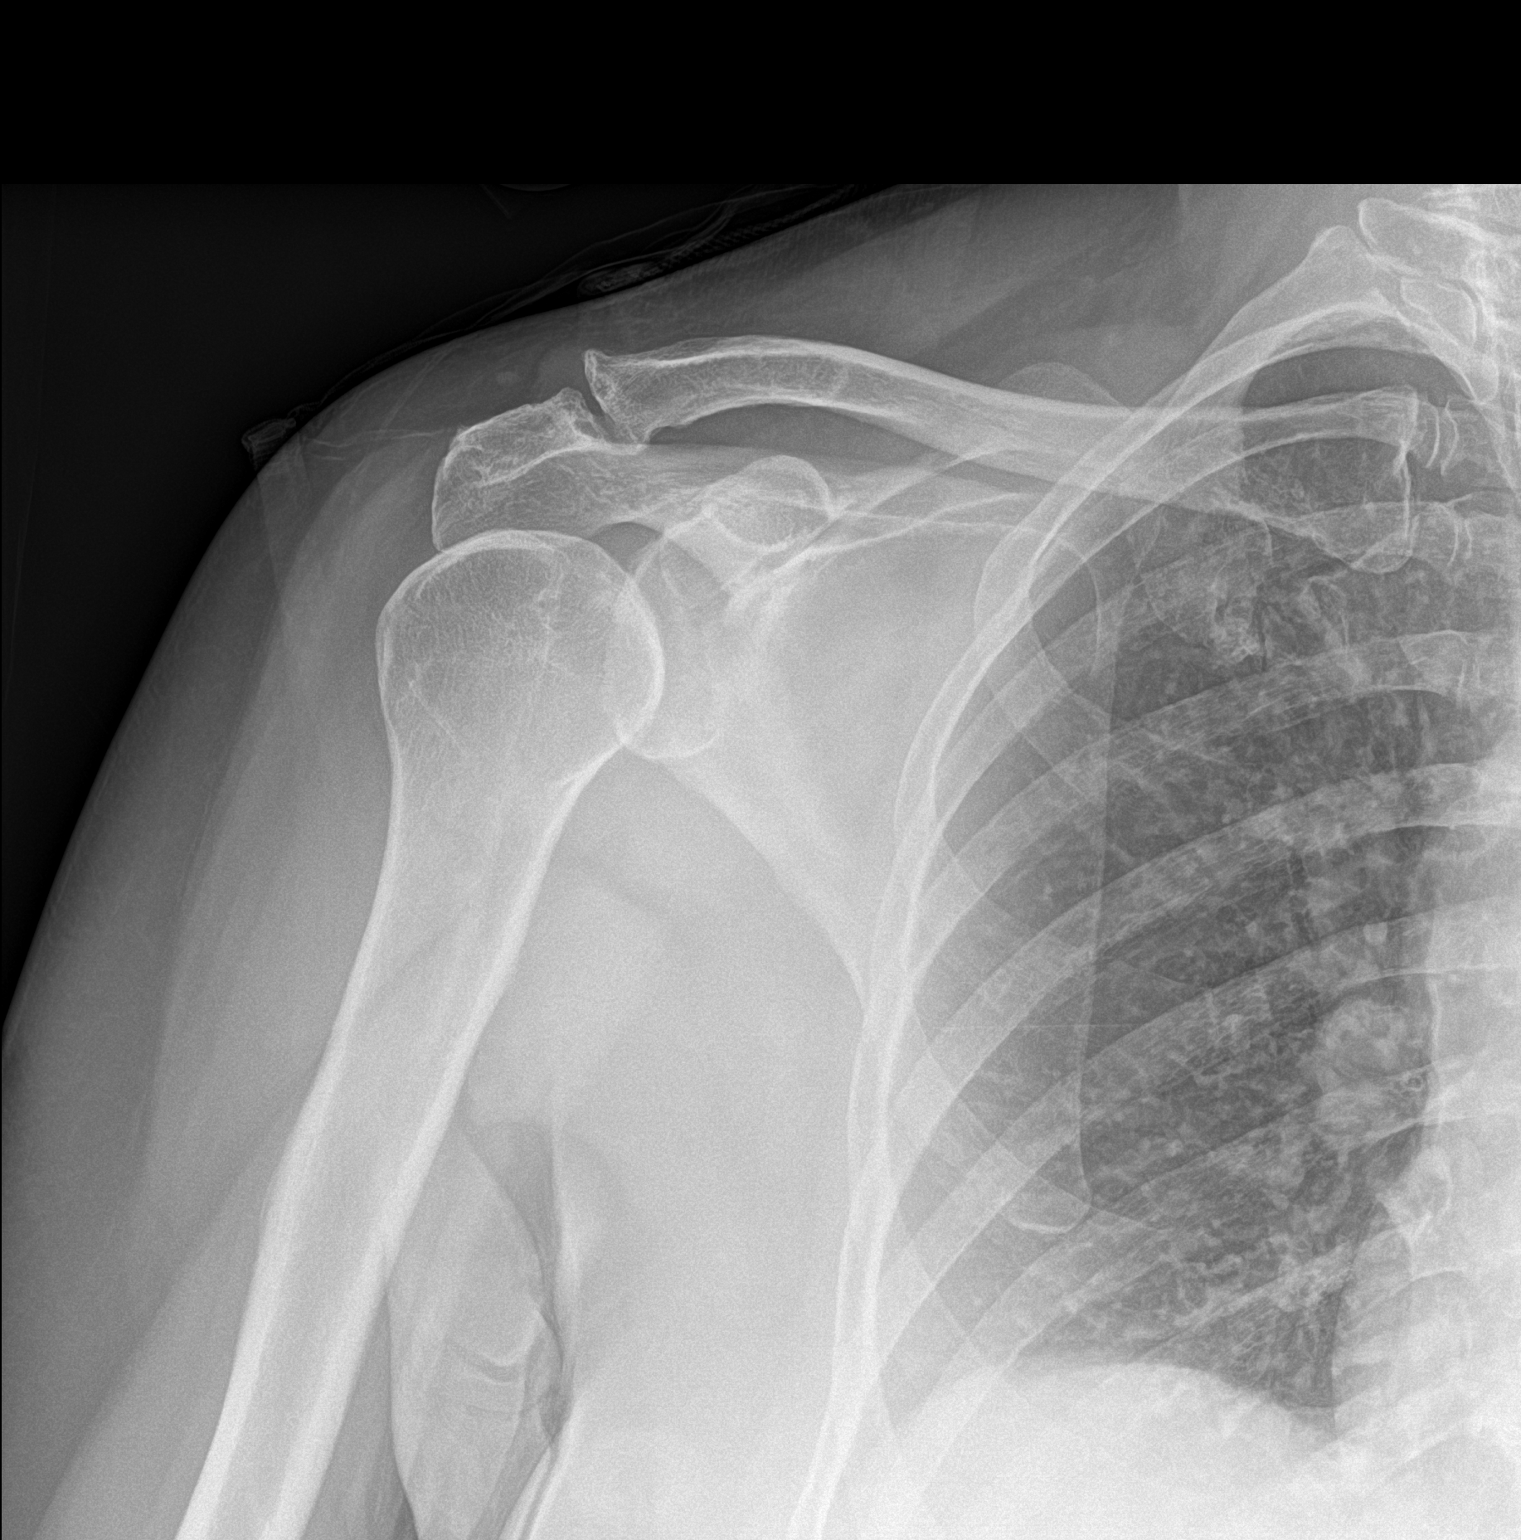

[2 of 2 positions shown; findings below may reference images not displayed]

FINDINGS: There is normal bony alignment.

No evidence of acute osseous or articular abnormality.

Degenerative changes of the acromioclavicular joint.
IMPRESSION: No evidence of acute osseous or articular abnormality.

Degenerative changes of the acromioclavicular joint.

## 2021-06-06 ENCOUNTER — Ambulatory Visit: Payer: Medicaid Other | Admitting: Family Medicine

## 2021-09-10 ENCOUNTER — Other Ambulatory Visit: Payer: Self-pay | Admitting: Family Medicine

## 2021-09-10 DIAGNOSIS — G894 Chronic pain syndrome: Secondary | ICD-10-CM

## 2021-09-10 NOTE — Telephone Encounter (Signed)
-   last filled 05/19/21 - last OV 12/2020 - canceled OV scheduled for 05/2021. - no future visit scheduled.

## 2021-09-16 ENCOUNTER — Other Ambulatory Visit: Payer: Self-pay | Admitting: Family Medicine

## 2021-09-16 DIAGNOSIS — G894 Chronic pain syndrome: Secondary | ICD-10-CM

## 2021-09-18 ENCOUNTER — Other Ambulatory Visit: Payer: Self-pay | Admitting: Family Medicine

## 2021-09-18 DIAGNOSIS — G894 Chronic pain syndrome: Secondary | ICD-10-CM

## 2021-09-19 ENCOUNTER — Telehealth: Payer: Self-pay | Admitting: Family Medicine

## 2021-09-19 NOTE — Telephone Encounter (Signed)
I called Pt to inform her an appt is needed. No answer. Left a voicemail. (IC)

## 2021-09-24 NOTE — Progress Notes (Unsigned)
HPI: Julie Perry is a 61 y.o. female, who is here today for follow-up She was last seen on 01/14/21. Chronic lower back and knee pain.  Pain is well controlled for a few hours after taking Tramadol 50 mg. At the end of the day after prolonged walking and chores at work pain is 10/10. Left lower back pain radiated to LLE sometimes. Negative for saddle anesthesia,numbness,tingling,or bowel/bladder dysfunction. Lumbar MRI in 07/2015: 1. Lumbarization of the S1 segment with bilateral pars defects and bilateral slight foraminal stenosis without spondylolisthesis. 2. Moderate bilateral facet arthritis at L3-4 with 1.5 mm spondylolisthesis  Knee pain is exacerbated by squatting, walking,and walking stairs. No edema or erythema. Takes Meloxicam 15 mg daily as needed. She cannot stand for long time to clean and washing dishes.  Concerned about not losing wt. She is trying to decrease sweets. Cream in coffee and sugar. She does not exercise regularly.  Julie Perry has been going on for years, reported in 03/2018 when she established care. + Interrupted sleep. Nocturia q 2-3 hours. Problem has been going on for over a year. She does not drink much fluids during the day, usually does so at night. Denies dysuria,gross hematuria,or decreased urine output.  She is concerned about lesion right above right breast, which she has had for 2 years. No tenderness or pruritus. Slow growth.  Review of Systems  Constitutional:  Negative for activity change, appetite change and fever.  HENT:  Negative for mouth sores, nosebleeds and trouble swallowing.   Eyes:  Negative for redness and visual disturbance.  Respiratory:  Negative for cough, shortness of breath and wheezing.   Cardiovascular:  Negative for chest pain, palpitations and leg swelling.  Gastrointestinal:  Negative for abdominal pain, nausea and vomiting.       Negative for changes in bowel habits.  Genitourinary:  Negative for  decreased urine volume, difficulty urinating, dysuria and hematuria.  Neurological:  Negative for seizures, syncope, weakness, numbness and headaches.  Rest see pertinent positives and negatives per HPI.  Current Outpatient Medications on File Prior to Visit  Medication Sig Dispense Refill   levothyroxine (SYNTHROID) 88 MCG tablet 1.5 tab M-W-F and 1 tab Tue-Thurs-Sat-Sun 108 tablet 2   No current facility-administered medications on file prior to visit.   Past Medical History:  Diagnosis Date   GERD (gastroesophageal reflux disease)    Thyroid disease    No Known Allergies  Social History   Socioeconomic History   Marital status: Married    Spouse name: Not on file   Number of children: Not on file   Years of education: Not on file   Highest education level: Not on file  Occupational History   Not on file  Tobacco Use   Smoking status: Former    Types: Cigarettes   Smokeless tobacco: Never  Substance and Sexual Activity   Alcohol use: Yes    Comment: Rarely once a year   Drug use: No   Sexual activity: Not on file  Other Topics Concern   Not on file  Social History Narrative   Work or School: Engineer, 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  Physical Exam Vitals and nursing note reviewed.  Constitutional:      General: She is not in acute distress.    Appearance: She  is well-developed.  HENT:     Head: Normocephalic and atraumatic.     Mouth/Throat:     Mouth: Mucous membranes are moist.     Pharynx: Oropharynx is clear.  Eyes:     Conjunctiva/sclera: Conjunctivae normal.  Cardiovascular:     Rate and Rhythm: Normal rate and regular rhythm.     Pulses:          Posterior tibial pulses are 2+ on the right side and 2+ on the left side.     Heart sounds: No murmur heard.    Comments: Varicose veins LE, bilateral. Pulmonary:     Effort: Pulmonary effort is normal. No respiratory distress.     Breath sounds: Normal breath sounds.  Abdominal:     Palpations: Abdomen is soft. There is no hepatomegaly or mass.     Tenderness: There is no abdominal tenderness.  Musculoskeletal:     Lumbar back: No tenderness. Negative right straight leg raise test and negative left straight leg raise test.     Right foot: Normal range of motion. Crepitus present.     Left foot: Normal range of motion. Crepitus present.  Lymphadenopathy:     Cervical: No cervical adenopathy.  Skin:    General: Skin is warm.     Findings: No erythema or rash.       Neurological:     General: No focal deficit present.     Mental Status: She is alert and oriented to person, place, and time.     Cranial Nerves: No cranial nerve deficit.     Gait: Gait normal.  Psychiatric:     Comments: Well groomed, good eye contact.   ASSESSMENT AND PLAN:  [name] was seen today for medication refill.  Diagnoses and all orders for this visit: Orders Placed This Encounter  Procedures   Urinalysis with Reflex Microscopic   POC HgB A1c   Lab Results  Component Value Date   HGBA1C 5.2 09/26/2021   Urine frequency Chronic. ? Overactive bladder. Avoiding fluid intake 3-4 hours before bedtime may help. We discussed pharmacologic options and some side effects. Further recommendations according to UA results. HgA1C in normal range.  Screening for diabetes mellitus -     POC HgB  A1c  Sebaceous cyst of skin of right breast We discussed Dx,prognosis,and treatment. It is small and asymptomatic. Continue monitoring for changes.  Osteoarthritis of both knees We discussed some side effects of NSAID's. Continue Meloxicam 15 mg daily prn. Recommend avoiding activities that can aggravate problem. Wt loss will also help.  Chronic pain disorder Tramadol is controlling pain adequately. PMP reviewed. Med contract current.  Chronic bilateral low back pain with bilateral sciatica Otherwise stable. Continue tramadol 50 mg twice daily and meloxicam 15 mg daily. We discussed some side effects of medication. Weight loss will help. Avoid activities that can aggravate pain. Because of cost, she would like  to hold on orthopedist evaluation.   Class 2 obesity with body mass index (BMI) of 38.0 to 38.9 in adult Weight has been otherwise stable. She understands the benefits of wt loss as well as adverse effects of obesity. Consistency with healthy diet and physical activity encouraged.   Return in about 6 months (around 03/28/2022).  Devine Dant G. [place], MD  [facility] [place] office.

## 2021-09-26 ENCOUNTER — Encounter: Payer: Self-pay | Admitting: Family Medicine

## 2021-09-26 ENCOUNTER — Ambulatory Visit (INDEPENDENT_AMBULATORY_CARE_PROVIDER_SITE_OTHER): Payer: Medicaid Other | Admitting: Family Medicine

## 2021-09-26 DIAGNOSIS — M5441 Lumbago with sciatica, right side: Secondary | ICD-10-CM

## 2021-09-26 DIAGNOSIS — G894 Chronic pain syndrome: Secondary | ICD-10-CM

## 2021-09-26 DIAGNOSIS — R350 Frequency of micturition: Secondary | ICD-10-CM

## 2021-09-26 DIAGNOSIS — M5442 Lumbago with sciatica, left side: Secondary | ICD-10-CM

## 2021-09-26 DIAGNOSIS — M170 Bilateral primary osteoarthritis of knee: Secondary | ICD-10-CM

## 2021-09-26 DIAGNOSIS — Z131 Encounter for screening for diabetes mellitus: Secondary | ICD-10-CM

## 2021-09-26 DIAGNOSIS — N6081 Other benign mammary dysplasias of right breast: Secondary | ICD-10-CM

## 2021-09-26 DIAGNOSIS — Z6838 Body mass index (BMI) 38.0-38.9, adult: Secondary | ICD-10-CM

## 2021-09-26 DIAGNOSIS — G8929 Other chronic pain: Secondary | ICD-10-CM

## 2021-09-26 LAB — POCT GLYCOSYLATED HEMOGLOBIN (HGB A1C): Hemoglobin A1C: 5.2 % (ref 4.0–5.6)

## 2021-09-26 MED ORDER — TRAMADOL HCL 50 MG PO TABS: ORAL_TABLET | ORAL | 3 refills | Status: DC

## 2021-09-26 MED ORDER — MELOXICAM 15 MG PO TABS: 15.0000 mg | ORAL_TABLET | Freq: Every day | ORAL | 1 refills | Status: DC

## 2021-09-26 NOTE — Assessment & Plan Note (Addendum)
Otherwise stable. Continue tramadol 50 mg twice daily and meloxicam 15 mg daily. We discussed some side effects of medication. Weight loss will help. Avoid activities that can aggravate pain. Because of cost, she would like to hold on orthopedist evaluation.

## 2021-09-26 NOTE — Assessment & Plan Note (Signed)
Tramadol is controlling pain adequately. PMP reviewed. Med contract current.

## 2021-09-26 NOTE — Patient Instructions (Addendum)
A few things to remember from today's visit:   Chronic pain disorder  Chronic bilateral low back pain with bilateral sciatica  Osteoarthritis of both knees, unspecified osteoarthritis type - Plan: meloxicam (MOBIC) 15 MG tablet  Urine frequency - Plan: Urinalysis, Routine w reflex microscopic  Screening for diabetes mellitus  Sebaceous cyst of skin of right breast   If you need refills please call your pharmacy. Do not use My Chart to request refills or for acute issues that need immediate attention.   Epidermoid Cyst  An epidermoid cyst, also called an epidermal cyst, is a small lump under your skin. The cyst contains a substance called keratin. Do not try to pop or open the cyst yourself. What are the causes? A blocked hair follicle. A hair that curls and re-enters the skin instead of growing straight out of the skin. A blocked pore. Irritated skin. An injury to the skin. Certain conditions that are passed along from parent to child. Human papillomavirus (HPV). This happens rarely when cysts occur on the bottom of the feet. Long-term sun damage to the skin. What increases the risk? Having acne. Being female. Having an injury to the skin. Being past puberty. Having certain conditions caused by genes (genetic disorder) What are the signs or symptoms? These cysts are usually harmless, but they can get infected. Symptoms of infection may include: Redness. Inflammation. Tenderness. Warmth. Fever. A bad-smelling substance that drains from the cyst. Pus that drains from the cyst. How is this treated? In many cases, epidermoid cysts go away on their own without treatment. If a cyst becomes infected, treatment may include: Opening and draining the cyst, done by a doctor. After draining, you may need minor surgery to remove the rest of the cyst. Antibiotic medicine. Shots of medicines (steroids) that help to reduce inflammation. Surgery to remove the cyst. Surgery may be done  if the cyst: Becomes large. Bothers you. Has a chance of turning into cancer. Do not try to open a cyst yourself. Follow these instructions at home: Medicines Take over-the-counter and prescription medicines as told by your doctor. If you were prescribed an antibiotic medicine, take it as told by your doctor. Do not stop taking it even if you start to feel better. General instructions Keep the area around your cyst clean and dry. Wear loose, dry clothing. Avoid touching your cyst. Check your cyst every day for signs of infection. Check for: Redness, swelling, or pain. Fluid or blood. Warmth. Pus or a bad smell. Keep all follow-up visits. How is this prevented? Wear clean, dry, clothing. Avoid wearing tight clothing. Keep your skin clean and dry. Take showers or baths every day. Contact a doctor if: Your cyst has symptoms of infection. Your condition does not improve or gets worse. You have a cyst that looks different from other cysts you have had. You have a fever. Get help right away if: Redness spreads from the cyst into the area close by. Summary An epidermoid cyst is a small lump under your skin. If a cyst becomes infected, treatment may include surgery to open and drain the cyst, or to remove it. Take over-the-counter and prescription medicines only as told by your doctor. Contact a doctor if your condition is not improving or is getting worse. Keep all follow-up visits. This information is not intended to replace advice given to you by your health care provider. Make sure you discuss any questions you have with your health care provider. Document Revised: 06/21/2019 Document Reviewed: 06/21/2019 Elsevier  Patient Education  2023 [employer]  Please be sure medication list is accurate. If a new problem present, please set up appointment sooner than planned today.

## 2021-09-26 NOTE — Assessment & Plan Note (Signed)
Weight has been otherwise stable. She understands the benefits of wt loss as well as adverse effects of obesity. Consistency with healthy diet and physical activity encouraged. 

## 2021-09-26 NOTE — Assessment & Plan Note (Addendum)
We discussed some side effects of NSAID's. Continue Meloxicam 15 mg daily prn. Recommend avoiding activities that can aggravate problem. Wt loss will also help.

## 2021-09-27 ENCOUNTER — Encounter: Payer: Self-pay | Admitting: Family Medicine

## 2021-09-29 ENCOUNTER — Other Ambulatory Visit: Payer: Self-pay | Admitting: Family Medicine

## 2021-09-29 DIAGNOSIS — M170 Bilateral primary osteoarthritis of knee: Secondary | ICD-10-CM

## 2021-12-10 ENCOUNTER — Other Ambulatory Visit: Payer: Self-pay | Admitting: Family Medicine

## 2021-12-10 DIAGNOSIS — E039 Hypothyroidism, unspecified: Secondary | ICD-10-CM

## 2021-12-25 ENCOUNTER — Telehealth: Payer: Self-pay | Admitting: Family Medicine

## 2021-12-25 DIAGNOSIS — M170 Bilateral primary osteoarthritis of knee: Secondary | ICD-10-CM

## 2021-12-25 NOTE — Telephone Encounter (Signed)
Pt would like a refill on meloxicam (MOBIC) 15 MG tablet  [place], [place] - [address] [place] [address] [place] Phone:  [phone]  Fax:  [phone]

## 2021-12-26 MED ORDER — MELOXICAM 15 MG PO TABS: 15.0000 mg | ORAL_TABLET | Freq: Every day | ORAL | 1 refills | Status: DC

## 2021-12-26 NOTE — Telephone Encounter (Signed)
Rx sent in

## 2022-02-27 DIAGNOSIS — Z419 Encounter for procedure for purposes other than remedying health state, unspecified: Secondary | ICD-10-CM | POA: Diagnosis not present

## 2022-03-10 ENCOUNTER — Other Ambulatory Visit: Payer: Self-pay | Admitting: Family Medicine

## 2022-03-10 DIAGNOSIS — E039 Hypothyroidism, unspecified: Secondary | ICD-10-CM

## 2022-03-30 DIAGNOSIS — Z419 Encounter for procedure for purposes other than remedying health state, unspecified: Secondary | ICD-10-CM | POA: Diagnosis not present

## 2022-04-13 ENCOUNTER — Other Ambulatory Visit: Payer: Self-pay | Admitting: Family Medicine

## 2022-04-13 DIAGNOSIS — G894 Chronic pain syndrome: Secondary | ICD-10-CM

## 2022-04-15 ENCOUNTER — Other Ambulatory Visit: Payer: Self-pay | Admitting: Family Medicine

## 2022-04-15 DIAGNOSIS — E039 Hypothyroidism, unspecified: Secondary | ICD-10-CM

## 2022-04-20 ENCOUNTER — Other Ambulatory Visit: Payer: Self-pay | Admitting: Family Medicine

## 2022-04-20 DIAGNOSIS — G894 Chronic pain syndrome: Secondary | ICD-10-CM

## 2022-04-20 NOTE — Telephone Encounter (Signed)
Pt called to request a refill of the following:  levothyroxine (SYNTHROID) 88 MCG tablet  traMADol (ULTRAM) 50 MG tablet  Pt states she has about a week's worth left.   LOV:  09/26/21  Please advise.

## 2022-04-20 NOTE — Telephone Encounter (Signed)
Needs to make an appointment.

## 2022-04-22 MED ORDER — TRAMADOL HCL 50 MG PO TABS: ORAL_TABLET | ORAL | 0 refills | Status: DC

## 2022-04-22 NOTE — Telephone Encounter (Signed)
Pt will be here on 04/29/22

## 2022-04-28 NOTE — Progress Notes (Signed)
HPI: Julie Perry is a 62 y.o. female, who is here today for follow up. She was last seen on 09/26/21, no new problems since then.  Chronic pain:Lower back pain and knee pain: She takes Tramadol 50 mg bid prn and Meloxical 15 mg daily prn. Back pain sometimes radiates to RLE sometimes. Negative for LE's numbness/tingling,saddle anesthesia or urine/bowel continence changes.   She reports experiencing severe lower back pain after lifting a heavy object at work a couple of days ago. Additionally to her prescribed pain medications she took Tylenol 650 mg  She takes Tramadol while working, prolonged walking,mopping and sweeping exacerbated back pain. The pain is currently rated between 6 and 7 on a scale of 10.  Tramadol does not cause drowsiness and can perform all work related functions.  Lumbar MRI in 07/2015:1. Lumbarization of the S1 segment with bilateral.  Hypothyroidism:Currently she is on Levothyroxine 88 mcg 1.5 tab [id number], 1 tab rest of the days. Tolerating medication well, no side effects reported. She has not noted dysphagia, palpitations, abdominal pain, changes in bowel habits, tremor, cold/heat intolerance, or abnormal weight loss. TSH was 5.2 in 08/2021.  Review of Systems  Constitutional:  Negative for activity change, appetite change and fever.  HENT:  Negative for mouth sores and nosebleeds.   Eyes:  Negative for redness and visual disturbance.  Respiratory:  Negative for cough, shortness of breath and wheezing.   Cardiovascular:  Negative for chest pain and palpitations.  Gastrointestinal:  Negative for abdominal pain, nausea and vomiting.  Genitourinary:  Negative for decreased urine volume, dysuria and hematuria.  Skin:  Negative for rash.  Neurological:  Negative for syncope and weakness.  See other pertinent positives and negatives in HPI.  Current Outpatient Medications on File Prior to Visit  Medication Sig Dispense Refill   levothyroxine (SYNTHROID) 88 MCG  tablet TAKE 1 & 1/2 (ONE & ONE-HALF) TABLETS BY MOUTH ON MONDAY, WEDNESDAY AND FRIDAY AND 1 TABLET ON TUESDAY, THURSDAY, SATURDAY, AND SUNDAY 108 tablet 0   meloxicam (MOBIC) 15 MG tablet Take 1 tablet (15 mg total) by mouth daily. 90 tablet 1   traMADol (ULTRAM) 50 MG tablet TAKE 2 TABLETS BY MOUTH ONCE DAILY AS NEEDED 60 tablet 0   No current facility-administered medications on file prior to visit.   Past Medical History:  Diagnosis Date   GERD (gastroesophageal reflux disease)    Thyroid disease    No Known Allergies  Social History   Socioeconomic History   Marital status: Married    Spouse name: Not on file   Number of children: Not on file   Years of education: Not on file   Highest education level: Not on file  Occupational History   Not on file  Tobacco Use   Smoking status: Former    Types: Cigarettes   Smokeless tobacco: Never  Substance and Sexual Activity   Alcohol use: Yes    Comment: Rarely once a year   Drug use: No   Sexual activity: Not on file  Other Topics Concern   Not on file  Social History Narrative   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, music Situation: lives with son (86 yo)      Spiritual Beliefs: Christian      Lifestyle: no regular exercise, diet is so so            Social Determinants of Radio broadcast assistant Strain: Not on file  Food Insecurity: Not on file  Transportation Needs: Not on file  Physical Activity: Not on file  Stress: Not on file  Social Connections: Not on file   Vitals:   04/29/22 0953  BP: 126/80  Pulse: 95  Resp: 16  Temp: 98 F (36.7 C)  SpO2: 98%   Wt Readings from Last 3 Encounters:  04/29/22 195 lb 4 oz (88.6 kg)  09/26/21 198 lb (89.8 kg)  01/14/21 198 lb (89.8 kg)   Body mass index is 35.71 kg/m.  Physical Exam Vitals and nursing note reviewed.  Constitutional:      General: She is not in acute distress.    Appearance: She is well-developed.  HENT:      Head: Normocephalic and atraumatic.     Mouth/Throat:     Mouth: Mucous membranes are moist.     Pharynx: Oropharynx is clear.  Eyes:     Conjunctiva/sclera: Conjunctivae normal.  Neck:     Thyroid: Thyromegaly (? thyroid nodules, R>L) present. No thyroid tenderness.  Cardiovascular:     Rate and Rhythm: Normal rate and regular rhythm.     Pulses:          Dorsalis pedis pulses are 2+ on the right side and 2+ on the left side.     Heart sounds: No murmur heard. Pulmonary:     Effort: Pulmonary effort is normal. No respiratory distress.     Breath sounds: Normal breath sounds.  Abdominal:     Palpations: Abdomen is soft. There is no hepatomegaly or mass.     Tenderness: There is no abdominal tenderness.  Musculoskeletal:     Lumbar back: No tenderness or bony tenderness. Negative right straight leg raise test and negative left straight leg raise test.  Lymphadenopathy:     Cervical: No cervical adenopathy.  Skin:    General: Skin is warm.     Findings: No erythema or rash.  Neurological:     General: No focal deficit present.     Mental Status: She is alert and oriented to person, place, and time.     Cranial Nerves: No cranial nerve deficit.     Gait: Gait normal.  Psychiatric:        Mood and Affect: Mood and affect normal.   ASSESSMENT AND PLAN:  Ms.Chihiro was seen today for medical management of chronic issues.  Diagnoses and all orders for this visit:  Lab Results  Component Value Date   TSH 4.87 04/29/2022   Lab Results  Component Value Date   CHOL 179 04/29/2022   HDL 54.10 04/29/2022   LDLCALC 101 (H) 04/29/2022   TRIG 124.0 04/29/2022   CHOLHDL 3 04/29/2022   Lab Results  Component Value Date   CREATININE 0.81 04/29/2022   BUN 15 04/29/2022   NA 139 04/29/2022   K 4.1 04/29/2022   CL 103 04/29/2022   CO2 31 04/29/2022   Osteoarthritis of both knees, unspecified osteoarthritis type Assessment & Plan: We discussed some side effects of  NSAID's. Continue Meloxicam 15 mg daily prn. Wt loss will also help.   Chronic pain disorder Assessment & Plan: Tramadol is controlling pain adequately. PMP reviewed, Rx last filled 04/22/22. Med contract renewed today.  Orders: -     Basic metabolic panel; Future -     traMADol HCl; TAKE 2 TABLETS BY MOUTH ONCE DAILY AS NEEDED  Dispense: 60 tablet; Refill: 3  Chronic bilateral low back pain with bilateral sciatica Assessment & Plan: Otherwise stable. Continue  tramadol 50 mg twice daily and meloxicam 15 mg daily. We discussed some side effects of medication. Recommend avoiding activities that could aggravate problem like heavy lifting.   Acquired hypothyroidism Assessment & Plan: Last TSH 5.2 in 08/2021. Continue same Levothyroxine dose. Further recommendation will be given according to TSH results. On examination thyroid seems mildly enlarged, ? Nodules. Thyroid US will be arranged.  Orders: -     TSH; Future -     Lipid panel; Future  Screening for lipid disorders -     Lipid panel; Future  Enlarged thyroid gland -     US THYROID; Future  Class 2 severe obesity due to excess calories with serious comorbidity and body mass index (BMI) of 38.0 to 38.9 in adult [facility]) Assessment & Plan: Weight has lost about 3 Lb sine 08/2021. Consistency with healthy diet and physical activity encouraged.    Return in about 6 months (around 10/28/2022) for chronic problems.  Jigar Zielke G. [place], MD  [facility]. [place] office.

## 2022-04-29 ENCOUNTER — Ambulatory Visit (INDEPENDENT_AMBULATORY_CARE_PROVIDER_SITE_OTHER): Payer: Medicaid Other | Admitting: Family Medicine

## 2022-04-29 ENCOUNTER — Encounter: Payer: Self-pay | Admitting: Family Medicine

## 2022-04-29 VITALS — BP 126/80 | HR 95 | Temp 98.0°F | Resp 16 | Ht 62.0 in | Wt 195.2 lb

## 2022-04-29 DIAGNOSIS — Z6838 Body mass index (BMI) 38.0-38.9, adult: Secondary | ICD-10-CM

## 2022-04-29 DIAGNOSIS — M170 Bilateral primary osteoarthritis of knee: Secondary | ICD-10-CM | POA: Diagnosis not present

## 2022-04-29 DIAGNOSIS — G8929 Other chronic pain: Secondary | ICD-10-CM | POA: Diagnosis not present

## 2022-04-29 DIAGNOSIS — E049 Nontoxic goiter, unspecified: Secondary | ICD-10-CM | POA: Diagnosis not present

## 2022-04-29 DIAGNOSIS — Z13220 Encounter for screening for lipoid disorders: Secondary | ICD-10-CM | POA: Diagnosis not present

## 2022-04-29 DIAGNOSIS — E039 Hypothyroidism, unspecified: Secondary | ICD-10-CM

## 2022-04-29 DIAGNOSIS — M5442 Lumbago with sciatica, left side: Secondary | ICD-10-CM

## 2022-04-29 DIAGNOSIS — G894 Chronic pain syndrome: Secondary | ICD-10-CM

## 2022-04-29 DIAGNOSIS — M5441 Lumbago with sciatica, right side: Secondary | ICD-10-CM | POA: Diagnosis not present

## 2022-04-29 LAB — LIPID PANEL
Cholesterol: 179 mg/dL (ref 0–200)
HDL: 54.1 mg/dL (ref >39.00)
LDL Cholesterol: 101 mg/dL — ABNORMAL HIGH (ref 0–99)
NonHDL: 125.34
Total CHOL/HDL Ratio: 3
Triglycerides: 124 mg/dL (ref 0.0–149.0)
VLDL: 24.8 mg/dL (ref 0.0–40.0)

## 2022-04-29 LAB — BASIC METABOLIC PANEL
BUN: 15 mg/dL (ref 6–23)
CO2: 31 mEq/L (ref 19–32)
Calcium: 9 mg/dL (ref 8.4–10.5)
Chloride: 103 mEq/L (ref 96–112)
Creatinine, Ser: 0.81 mg/dL (ref 0.40–1.20)
GFR: 77.12 mL/min (ref >60.00)
Glucose, Bld: 79 mg/dL (ref 70–99)
Potassium: 4.1 mEq/L (ref 3.5–5.1)
Sodium: 139 mEq/L (ref 135–145)

## 2022-04-29 LAB — TSH: TSH: 4.87 u[IU]/mL (ref 0.35–5.50)

## 2022-04-29 NOTE — Patient Instructions (Addendum)
A few things to remember from today's visit:  Osteoarthritis of both knees, unspecified osteoarthritis type  Chronic pain disorder - Plan: Basic metabolic panel  Chronic bilateral low back pain with bilateral sciatica  Acquired hypothyroidism - Plan: TSH, Lipid panel  Screening for lipid disorders - Plan: Lipid panel  Enlarged thyroid gland - Plan: US THYROID  No changes today. Med contract signed. Avoid activities that could aggravate pain.  If you need refills for medications you take chronically, please call your pharmacy. Do not use My Chart to request refills or for acute issues that need immediate attention. If you send a my chart message, it may take a few days to be addressed, specially if I am not in the office.  Please be sure medication list is accurate. If a new problem present, please set up appointment sooner than planned today.

## 2022-04-30 DIAGNOSIS — Z419 Encounter for procedure for purposes other than remedying health state, unspecified: Secondary | ICD-10-CM | POA: Diagnosis not present

## 2022-05-02 ENCOUNTER — Encounter: Payer: Self-pay | Admitting: Family Medicine

## 2022-05-02 MED ORDER — TRAMADOL HCL 50 MG PO TABS: ORAL_TABLET | ORAL | 3 refills | Status: DC

## 2022-05-02 NOTE — Assessment & Plan Note (Addendum)
Last TSH 5.2 in 08/2021. Continue same Levothyroxine dose. Further recommendation will be given according to TSH results. On examination thyroid seems mildly enlarged, ? Nodules. Thyroid US will be arranged.

## 2022-05-02 NOTE — Assessment & Plan Note (Signed)
We discussed some side effects of NSAID's. Continue Meloxicam 15 mg daily prn. Wt loss will also help.

## 2022-05-02 NOTE — Assessment & Plan Note (Addendum)
Weight has lost about 3 Lb sine 08/2021. Consistency with healthy diet and physical activity encouraged.

## 2022-05-02 NOTE — Assessment & Plan Note (Signed)
Otherwise stable. Continue tramadol 50 mg twice daily and meloxicam 15 mg daily. We discussed some side effects of medication. Recommend avoiding activities that could aggravate problem like heavy lifting.

## 2022-05-02 NOTE — Assessment & Plan Note (Addendum)
Tramadol is controlling pain adequately. PMP reviewed, Rx last filled 04/22/22. Med contract renewed today.

## 2022-05-05 ENCOUNTER — Other Ambulatory Visit: Payer: Self-pay | Admitting: Family Medicine

## 2022-05-05 DIAGNOSIS — E039 Hypothyroidism, unspecified: Secondary | ICD-10-CM

## 2022-05-20 ENCOUNTER — Ambulatory Visit
Admission: RE | Admit: 2022-05-20 | Discharge: 2022-05-20 | Disposition: A | Discharge status: Home / Self Care | Payer: Medicaid Other | Source: Ambulatory Visit | Attending: Family Medicine | Admitting: Family Medicine

## 2022-05-20 DIAGNOSIS — E049 Nontoxic goiter, unspecified: Secondary | ICD-10-CM

## 2022-05-29 DIAGNOSIS — Z419 Encounter for procedure for purposes other than remedying health state, unspecified: Secondary | ICD-10-CM | POA: Diagnosis not present

## 2022-06-29 DIAGNOSIS — Z419 Encounter for procedure for purposes other than remedying health state, unspecified: Secondary | ICD-10-CM | POA: Diagnosis not present

## 2022-07-29 DIAGNOSIS — Z419 Encounter for procedure for purposes other than remedying health state, unspecified: Secondary | ICD-10-CM | POA: Diagnosis not present

## 2022-08-29 DIAGNOSIS — Z419 Encounter for procedure for purposes other than remedying health state, unspecified: Secondary | ICD-10-CM | POA: Diagnosis not present

## 2022-09-28 DIAGNOSIS — Z419 Encounter for procedure for purposes other than remedying health state, unspecified: Secondary | ICD-10-CM | POA: Diagnosis not present

## 2022-10-06 ENCOUNTER — Telehealth: Payer: Self-pay

## 2022-10-06 NOTE — Telephone Encounter (Signed)
Chart review completed for patient. Patient is due for screening mammogram. Left message on voice mail for patient to inquire about scheduling mammogram.  Bowie Doiron, Population Health Specialist.   

## 2022-10-29 DIAGNOSIS — Z419 Encounter for procedure for purposes other than remedying health state, unspecified: Secondary | ICD-10-CM | POA: Diagnosis not present

## 2022-11-02 NOTE — Progress Notes (Unsigned)
HPI: Julie Perry is a 62 y.o. female, who is here today for chronic disease management.  Last seen on 04/29/22. No new problems since her last visit.  -Today she is c/o fatigue, she does not feel rested upon waking up.   She sleeps 5-6 hours per night with frequent awakenings to use the bathroom. On days off she sleeps until 9-10am. No known hx of OSA, she reports sleeping with mouth open and whistling noise with breathing. She also has stuffy nose , attributed to seasonal allergies.  She would like to have TSH re-checked. She is on Levothyroxine 88 mcg daily, drinks coffee right after taking medication. Lab Results  Component Value Date   TSH 4.87 04/29/2022   Thyroid US in 04/2022: 1. Borderline enlarged, markedly heterogeneous and potentially hyperemic thyroid without worrisome nodule mass. Findings are nonspecific though could be seen in the setting of an acute thyroiditis.  2. Solitary 1.4 cm right-sided nodule/pseudonodule does not meet imaging criteria to recommend percutaneous sampling or continued dedicated follow-up  -She has a longstanding history of frequent urination and nocturia. She is overdue for her pap smear. Negative for gross hematuria,dysuria,or vaginal bleeding.  -Chronic pain:Lower back pain and knee pain. She takes Tramadol 50 mg bid prn and Meloxical 15 mg daily prn.  The pain is generally stable with occasional radiation to either leg, 1-2 times per month. She also has seldom numbness and/or tingling sensations in her legs at times.  Back pain is exacerbated by certain activities/prolonged walking/standing and alleviated by rest. Pain 6-7/10. She uses an OTC [employer] brand arthritis medication to help with pain.   Negative for saddle anesthesia or urine/bowel function changes.   She has tolerated medications well, no side effects reported. Lumbar MRI in 07/2015:1. Lumbarization of the S1 segment with bilateral.  Her left knee pain worsens at night when  in bed. She works 3 nights per week and those days knee pain is worse. She has been evaluated by orthopedics before and has had intra articular knee injections in the past but found them painful.  She has increased bony prominence around MTP joints right hand and IP joint pain for the past 2 months.  She has not noted erythema. Right handed.   Review of Systems  Constitutional:  Positive for fatigue. Negative for activity change, appetite change, fever and unexpected weight change.  HENT:  Negative for mouth sores, sore throat and trouble swallowing.   Eyes:  Negative for redness and visual disturbance.  Respiratory:  Negative for cough, shortness of breath and wheezing.   Cardiovascular:  Negative for chest pain, palpitations and leg swelling.  Gastrointestinal:  Negative for abdominal pain, nausea and vomiting.       Negative for changes in bowel habits.  Endocrine: Negative for cold intolerance and heat intolerance.  Genitourinary:  Negative for decreased urine volume, difficulty urinating, dysuria and hematuria.  Musculoskeletal:  Positive for arthralgias and back pain.  Skin:  Negative for rash.  Neurological:  Negative for weakness and headaches.  Psychiatric/Behavioral:  Positive for sleep disturbance. Negative for confusion and hallucinations.   See other pertinent positives and negatives in HPI.  Current Outpatient Medications on File Prior to Visit  Medication Sig Dispense Refill   levothyroxine (SYNTHROID) 88 MCG tablet TAKE 1 & 1/2 (ONE & ONE-HALF) TABLETS BY MOUTH ON MONDAY, WEDNESDAY AND FRIDAY AND 1 TABLET ON TUESDAY, THURSDAY, SATURDAY, AND SUNDAY 108 tablet 5   No current facility-administered medications on file prior to visit.  Past Medical History:  Diagnosis Date   GERD (gastroesophageal reflux disease)    Thyroid disease    No Known Allergies  Social History   Socioeconomic History   Marital status: Married    Spouse name: Not on file   Number of  children: Not on file   Years of education: Not on file   Highest education level: Not on file  Occupational History   Not on file  Tobacco Use   Smoking status: Former    Types: Cigarettes   Smokeless tobacco: Never  Substance and Sexual Activity   Alcohol use: Yes    Comment: Rarely once a year   Drug use: No   Sexual activity: Not on file  Other Topics Concern   Not on file  Social History Narrative   Work or School: Engineer, technical sales Situation: lives with son (30 yo)      Spiritual Beliefs: Christian      Lifestyle: no regular exercise, diet is so so            Social Determinants of Corporate investment banker Strain: Not on file  Food Insecurity: Not on file  Transportation Needs: Not on file  Physical Activity: Not on file  Stress: Not on file  Social Connections: Not on file   Vitals:   11/03/22 1354  BP: 110/80  Pulse: 70  Resp: 16  Temp: 98.7 F (37.1 C)  SpO2: 98%   Body mass index is 36.95 kg/m.  Physical Exam Vitals and nursing note reviewed.  Constitutional:      General: She is not in acute distress.    Appearance: She is well-developed.  HENT:     Head: Normocephalic and atraumatic.     Mouth/Throat:     Mouth: Mucous membranes are moist.     Pharynx: Oropharynx is clear.  Eyes:     Conjunctiva/sclera: Conjunctivae normal.  Neck:     Thyroid: Thyromegaly present.  Cardiovascular:     Rate and Rhythm: Normal rate and regular rhythm.     Pulses:          Dorsalis pedis pulses are 2+ on the right side and 2+ on the left side.     Heart sounds: No murmur heard. Pulmonary:     Effort: Pulmonary effort is normal. No respiratory distress.     Breath sounds: Normal breath sounds.  Abdominal:     Palpations: Abdomen is soft. There is no hepatomegaly or mass.     Tenderness: There is no abdominal tenderness.  Musculoskeletal:     Right knee: Crepitus present. No erythema.     Left knee: Crepitus  present. No erythema. Decreased range of motion.     Comments: No signs of synovitis. A few nodular lesions on PIP and DIP joints.   Lymphadenopathy:     Cervical: No cervical adenopathy.  Skin:    General: Skin is warm.     Findings: No erythema or rash.  Neurological:     General: No focal deficit present.     Mental Status: She is alert and oriented to person, place, and time.     Cranial Nerves: No cranial nerve deficit.     Gait: Gait normal.  Psychiatric:        Mood and Affect: Mood and affect normal.    ASSESSMENT AND PLAN:  Ms. Aimar was seen today for medical management of chronic issues.  Diagnoses  and all orders for this visit:  Lab Results  Component Value Date   TSH 4.05 11/03/2022   Lab Results  Component Value Date   NA 140 11/03/2022   CL 104 11/03/2022   K 4.1 11/03/2022   CO2 26 11/03/2022   BUN 15 11/03/2022   CREATININE 0.75 11/03/2022   GFR 84.27 11/03/2022   CALCIUM 9.5 11/03/2022   ALBUMIN 4.3 11/03/2022   GLUCOSE 93 11/03/2022   Lab Results  Component Value Date   WBC 7.2 11/03/2022   HGB 13.3 11/03/2022   HCT 40.7 11/03/2022   MCV 89.6 11/03/2022   PLT 207.0 11/03/2022   Lab Results  Component Value Date   ALT 15 11/03/2022   AST 17 11/03/2022   ALKPHOS 76 11/03/2022   BILITOT 0.3 11/03/2022   Chronic bilateral low back pain with bilateral sciatica Assessment & Plan: Otherwise stable. Continue tramadol 50 mg twice daily and meloxicam 15 mg daily. Today Duloxetine 30 mg added. We discussed some side effects of medication as well as the risk for med interaction. F/U in 2 months, before if needed.  Orders: -     DULoxetine HCl; Take 1 capsule (30 mg total) by mouth daily.  Dispense: 30 capsule; Refill: 1  Chronic pain disorder Assessment & Plan: Pain in general adequately controlled. PDMP reviewed, Rx last filled 04/22/22. Med contract current.  Orders: -     traMADol HCl; TAKE 2 TABLETS BY MOUTH ONCE DAILY AS NEEDED   Dispense: 60 tablet; Refill: 3  Generalized osteoarthritis of multiple sites Assessment & Plan: Affecting mainly knees, back,and right hand. We discussed Dx,prognosis,and treatment options. She agrees with trying Duloxetine 30 mg daily. Continue Meloxicam 15 mg daily prn. Side effects of medications discussed.  She is not interested in referral to ortho at this time.  Orders: -     DULoxetine HCl; Take 1 capsule (30 mg total) by mouth daily.  Dispense: 30 capsule; Refill: 1 -     Meloxicam; Take 1 tablet (15 mg total) by mouth daily.  Dispense: 90 tablet; Refill: 1  Encounter for screening for malignant neoplasm of breast, unspecified screening modality -     3D Screening Mammogram, Left and Right; Future  Colon cancer screening -     Ambulatory referral to Gastroenterology  Asymptomatic postmenopausal estrogen deficiency -     DG Bone Density; Future  Acquired hypothyroidism Assessment & Plan: Last TSH in normal range. Continue same Levothyroxine dose, 45-60 min before any food or liquid,including coffee. Further recommendation will be given according to TSH results.  Orders: -     T4, free; Future -     TSH; Future  Urine frequency Assessment & Plan: Chronic. We discussed possible etiologies, ? Overactive bladder. UA ordered. We could consider pharmacologic treatment, side effects discussed. For now recommend limiting fluid intake 3-4 hours before bedtime to help with nocturia.  Orders: -     Urinalysis w microscopic + reflex cultur; Future  Chronic fatigue Assessment & Plan: We discussed possible etiologies: Systemic illness, immunologic,endocrinology,sleep disorder, psychiatric/psychologic, infectious,medications side effects, and idiopathic. Examination today does not suggest a serious process. Some of her chronic co morbilities could be contributing factors. Healthy diet and regular physical activity may help. Stressed the importance of updating cancer  screening according to recommendations based on her age.  Further recommendations will be given according to lab results.  Orders: -     CBC; Future -     Comprehensive metabolic panel; Future   I  spent a total of 43 minutes in both face to face and non face to face activities for this visit on the date of this encounter. During this time history was obtained and documented, examination was performed, prior labs/imaging reviewed, and assessment/plan discussed.  Return in about 2 months (around 01/03/2023) for chronic problems.  Aniketh Huberty G. [place], MD  [facility]. [place] office.

## 2022-11-03 ENCOUNTER — Encounter: Payer: Self-pay | Admitting: Family Medicine

## 2022-11-03 ENCOUNTER — Ambulatory Visit: Payer: Medicaid Other | Admitting: Family Medicine

## 2022-11-03 VITALS — BP 110/80 | HR 70 | Temp 98.7°F | Resp 16 | Ht 62.0 in | Wt 202.0 lb

## 2022-11-03 DIAGNOSIS — G8929 Other chronic pain: Secondary | ICD-10-CM

## 2022-11-03 DIAGNOSIS — R350 Frequency of micturition: Secondary | ICD-10-CM | POA: Diagnosis not present

## 2022-11-03 DIAGNOSIS — M5441 Lumbago with sciatica, right side: Secondary | ICD-10-CM

## 2022-11-03 DIAGNOSIS — Z1211 Encounter for screening for malignant neoplasm of colon: Secondary | ICD-10-CM | POA: Diagnosis not present

## 2022-11-03 DIAGNOSIS — E039 Hypothyroidism, unspecified: Secondary | ICD-10-CM | POA: Diagnosis not present

## 2022-11-03 DIAGNOSIS — Z780 Asymptomatic menopausal state: Secondary | ICD-10-CM

## 2022-11-03 DIAGNOSIS — M5442 Lumbago with sciatica, left side: Secondary | ICD-10-CM | POA: Diagnosis not present

## 2022-11-03 DIAGNOSIS — G894 Chronic pain syndrome: Secondary | ICD-10-CM | POA: Diagnosis not present

## 2022-11-03 DIAGNOSIS — R5382 Chronic fatigue, unspecified: Secondary | ICD-10-CM | POA: Diagnosis not present

## 2022-11-03 DIAGNOSIS — M170 Bilateral primary osteoarthritis of knee: Secondary | ICD-10-CM

## 2022-11-03 DIAGNOSIS — M159 Polyosteoarthritis, unspecified: Secondary | ICD-10-CM

## 2022-11-03 DIAGNOSIS — Z1239 Encounter for other screening for malignant neoplasm of breast: Secondary | ICD-10-CM | POA: Diagnosis not present

## 2022-11-03 MED ORDER — MELOXICAM 15 MG PO TABS
15.0000 mg | ORAL_TABLET | Freq: Every day | ORAL | 1 refills | Status: DC
Start: 2022-11-03 — End: 2023-06-30

## 2022-11-03 MED ORDER — TRAMADOL HCL 50 MG PO TABS
ORAL_TABLET | ORAL | 3 refills | Status: DC
Start: 2022-11-03 — End: 2023-07-01

## 2022-11-03 MED ORDER — DULOXETINE HCL 30 MG PO CPEP
30.0000 mg | ORAL_CAPSULE | Freq: Every day | ORAL | 1 refills | Status: DC
Start: 2022-11-03 — End: 2023-07-05

## 2022-11-03 NOTE — Patient Instructions (Addendum)
A few things to remember from today's visit:  Chronic pain disorder  Generalized osteoarthritis of multiple sites  Chronic bilateral low back pain with bilateral sciatica  Encounter for screening for malignant neoplasm of breast, unspecified screening modality - Plan: MM 3D SCREENING MAMMOGRAM BILATERAL BREAST  Colon cancer screening - Plan: Ambulatory referral to Gastroenterology  Asymptomatic postmenopausal estrogen deficiency - Plan: DG Bone Density  Daytime hypersomnolence - Plan: CBC, Comprehensive metabolic panel  Acquired hypothyroidism - Plan: T4, free, TSH  Urine frequency - Plan: Urinalysis with Culture Reflex, CANCELED: Urinalysis with Culture Reflex  Please establish with a gynecologist. Duloxetine added today, do not take it with Tramadol. No changes in rest. Let me know if you decide to go back to orthopedist.  If you need refills for medications you take chronically, please call your pharmacy. Do not use My Chart to request refills or for acute issues that need immediate attention. If you send a my chart message, it may take a few days to be addressed, specially if I am not in the office.  Please be sure medication list is accurate. If a new problem present, please set up appointment sooner than planned today.

## 2022-11-03 NOTE — Assessment & Plan Note (Signed)
Affecting mainly knees, back,and right hand. We discussed Dx,prognosis,and treatment options. She agrees with trying Duloxetine 30 mg daily. Continue Meloxicam 15 mg daily prn. Side effects of medications discussed.  She is not interested in referral to ortho at this time.

## 2022-11-03 NOTE — Assessment & Plan Note (Signed)
Pain in general adequately controlled. PDMP reviewed, Rx last filled 04/22/22. Med contract current.

## 2022-11-03 NOTE — Assessment & Plan Note (Signed)
Otherwise stable. Continue tramadol 50 mg twice daily and meloxicam 15 mg daily. Today Duloxetine 30 mg added. We discussed some side effects of medication as well as the risk for med interaction. F/U in 2 months, before if needed.

## 2022-11-03 NOTE — Assessment & Plan Note (Signed)
We discussed possible etiologies: Systemic illness, immunologic,endocrinology,sleep disorder, psychiatric/psychologic, infectious,medications side effects, and idiopathic. Examination today does not suggest a serious process. Some of her chronic co morbilities could be contributing factors. Healthy diet and regular physical activity may help. Stressed the importance of updating cancer screening according to recommendations based on her age.  Further recommendations will be given according to lab results.

## 2022-11-03 NOTE — Assessment & Plan Note (Signed)
Last TSH in normal range. Continue same Levothyroxine dose. Further recommendation will be given according to TSH results.

## 2022-11-04 ENCOUNTER — Other Ambulatory Visit: Payer: Self-pay | Admitting: Family Medicine

## 2022-11-04 DIAGNOSIS — Z780 Asymptomatic menopausal state: Secondary | ICD-10-CM

## 2022-11-05 NOTE — Assessment & Plan Note (Signed)
Chronic. We discussed possible etiologies, ? Overactive bladder. UA ordered. We could consider pharmacologic treatment, side effects discussed. For now recommend limiting fluid intake 3-4 hours before bedtime to help with nocturia.

## 2022-11-10 ENCOUNTER — Ambulatory Visit
Admission: RE | Admit: 2022-11-10 | Discharge: 2022-11-10 | Disposition: A | Discharge status: Home / Self Care | Payer: Medicaid Other | Source: Ambulatory Visit | Attending: Family Medicine | Admitting: Family Medicine

## 2022-11-10 DIAGNOSIS — Z1231 Encounter for screening mammogram for malignant neoplasm of breast: Secondary | ICD-10-CM | POA: Diagnosis not present

## 2022-11-10 DIAGNOSIS — Z1239 Encounter for other screening for malignant neoplasm of breast: Secondary | ICD-10-CM

## 2022-11-13 ENCOUNTER — Other Ambulatory Visit: Payer: Self-pay | Admitting: Family Medicine

## 2022-11-13 DIAGNOSIS — R928 Other abnormal and inconclusive findings on diagnostic imaging of breast: Secondary | ICD-10-CM

## 2022-11-16 ENCOUNTER — Ambulatory Visit
Admission: RE | Admit: 2022-11-16 | Discharge: 2022-11-16 | Disposition: A | Discharge status: Home / Self Care | Payer: Medicaid Other | Source: Ambulatory Visit | Attending: Family Medicine | Admitting: Family Medicine

## 2022-11-16 DIAGNOSIS — R928 Other abnormal and inconclusive findings on diagnostic imaging of breast: Secondary | ICD-10-CM

## 2022-11-20 ENCOUNTER — Other Ambulatory Visit: Payer: Medicaid Other

## 2022-11-29 DIAGNOSIS — Z419 Encounter for procedure for purposes other than remedying health state, unspecified: Secondary | ICD-10-CM | POA: Diagnosis not present

## 2022-12-29 DIAGNOSIS — Z419 Encounter for procedure for purposes other than remedying health state, unspecified: Secondary | ICD-10-CM | POA: Diagnosis not present

## 2023-01-29 DIAGNOSIS — Z419 Encounter for procedure for purposes other than remedying health state, unspecified: Secondary | ICD-10-CM | POA: Diagnosis not present

## 2023-02-01 DIAGNOSIS — M25532 Pain in left wrist: Secondary | ICD-10-CM | POA: Diagnosis not present

## 2023-02-02 DIAGNOSIS — R202 Paresthesia of skin: Secondary | ICD-10-CM | POA: Diagnosis not present

## 2023-02-02 DIAGNOSIS — G5602 Carpal tunnel syndrome, left upper limb: Secondary | ICD-10-CM | POA: Diagnosis not present

## 2023-02-28 DIAGNOSIS — Z419 Encounter for procedure for purposes other than remedying health state, unspecified: Secondary | ICD-10-CM | POA: Diagnosis not present

## 2023-03-31 DIAGNOSIS — Z419 Encounter for procedure for purposes other than remedying health state, unspecified: Secondary | ICD-10-CM | POA: Diagnosis not present

## 2023-05-01 DIAGNOSIS — Z419 Encounter for procedure for purposes other than remedying health state, unspecified: Secondary | ICD-10-CM | POA: Diagnosis not present

## 2023-05-07 ENCOUNTER — Other Ambulatory Visit: Payer: Medicaid Other

## 2023-05-09 DIAGNOSIS — E669 Obesity, unspecified: Secondary | ICD-10-CM | POA: Diagnosis not present

## 2023-05-09 DIAGNOSIS — M25531 Pain in right wrist: Secondary | ICD-10-CM | POA: Diagnosis not present

## 2023-05-09 DIAGNOSIS — Z6838 Body mass index (BMI) 38.0-38.9, adult: Secondary | ICD-10-CM | POA: Diagnosis not present

## 2023-05-29 DIAGNOSIS — Z419 Encounter for procedure for purposes other than remedying health state, unspecified: Secondary | ICD-10-CM | POA: Diagnosis not present

## 2023-06-29 NOTE — Progress Notes (Signed)
 HPI: [name] is a 63 y.o. female with a PMHx significant for GERD, hypothyroidism, chronic pain, OA, and insomnia, among some, who is here today for her routine physical.  Last CPE: more than one year ago.   Exercise: Diet:  Sleep:  Alcohol Use:  Smoking: Vision:  Dental:   There is no immunization history on file for this patient. Health Maintenance  Topic Date Due   COVID-19 Vaccine (1) Never done   DTaP/Tdap/Td (1 - Tdap) Never done   Zoster Vaccines- Shingrix (1 of 2) Never done   Cervical Cancer Screening (HPV/Pap Cotest)  11/21/2016   HIV Screening  11/05/2026 (Originally 08/26/1975)   INFLUENZA VACCINE  10/29/2023   MAMMOGRAM  11/09/2024   Hepatitis C Screening  Completed   HPV VACCINES  Aged Out   Chronic medical problems: ***  Concerns today: ***  Review of Systems  Current Outpatient Medications on File Prior to Visit  Medication Sig Dispense Refill   DULoxetine (CYMBALTA) 30 MG capsule Take 1 capsule (30 mg total) by mouth daily. 30 capsule 1   levothyroxine (SYNTHROID) 88 MCG tablet TAKE 1 & 1/2 (ONE & ONE-HALF) TABLETS BY MOUTH ON MONDAY, WEDNESDAY AND FRIDAY AND 1 TABLET ON TUESDAY, THURSDAY, SATURDAY, AND SUNDAY 108 tablet 5   meloxicam (MOBIC) 15 MG tablet Take 1 tablet (15 mg total) by mouth daily. 90 tablet 1   traMADol (ULTRAM) 50 MG tablet TAKE 2 TABLETS BY MOUTH ONCE DAILY AS NEEDED 60 tablet 3   No current facility-administered medications on file prior to visit.    Past Medical History:  Diagnosis Date   GERD (gastroesophageal reflux disease)    Thyroid disease     No past surgical history on file.  No Known Allergies  Family History  Problem Relation Age of Onset   Diabetes Mother    Breast cancer Maternal Aunt    Breast cancer Maternal Aunt    Collagen disease Maternal Grandmother    Collagen disease Other    Broken bones Neg Hx     Social History   Socioeconomic History   Marital status: Married    Spouse name: Not  on file   Number of children: Not on file   Years of education: Not on file   Highest education level: Not on file  Occupational History   Not on file  Tobacco Use   Smoking status: Former    Types: Cigarettes   Smokeless tobacco: Never  Substance and Sexual Activity   Alcohol use: Yes    Comment: Rarely once a year   Drug use: No   Sexual activity: Not on file  Other Topics Concern   Not on file  Social History Narrative   Work or School: Engineer, 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 There is no height or weight on file to calculate BMI.  Wt Readings from Last 3 Encounters:  11/03/22 202 lb (91.6 kg)  04/29/22  195 lb 4 oz (88.6 kg)  09/26/21 198 lb (89.8 kg)    Physical Exam  ASSESSMENT AND PLAN:  [name] was here today for her annual physical examination.  No orders of the defined types were placed in this encounter.   @ASSESSPLAN @  There are no diagnoses linked to this encounter.  No follow-ups on file.  I, Rolla Etienne Wierda, acting as a scribe for Sakina Briones [place], MD., have documented all relevant documentation on the behalf of Hesham Womac [place], MD, as directed by  Keyla Milone [place], MD while in the presence of Rasool Rommel [place], MD.   I, Breckin Zafar [place], MD, have reviewed all documentation for this visit. The documentation on 06/29/23 for the exam, diagnosis, procedures, and orders are all accurate and complete.  Altheria Shadoan G. [place], MD  [facility]. [place] [facility].

## 2023-06-30 ENCOUNTER — Encounter: Payer: Self-pay | Admitting: Family Medicine

## 2023-06-30 ENCOUNTER — Ambulatory Visit (INDEPENDENT_AMBULATORY_CARE_PROVIDER_SITE_OTHER): Admitting: Family Medicine

## 2023-06-30 VITALS — BP 124/88 | HR 85 | Temp 97.9°F | Resp 16 | Ht 62.0 in | Wt 200.4 lb

## 2023-06-30 DIAGNOSIS — R202 Paresthesia of skin: Secondary | ICD-10-CM

## 2023-06-30 DIAGNOSIS — R200 Anesthesia of skin: Secondary | ICD-10-CM

## 2023-06-30 DIAGNOSIS — M159 Polyosteoarthritis, unspecified: Secondary | ICD-10-CM

## 2023-06-30 DIAGNOSIS — Z0000 Encounter for general adult medical examination without abnormal findings: Secondary | ICD-10-CM | POA: Insufficient documentation

## 2023-06-30 DIAGNOSIS — E039 Hypothyroidism, unspecified: Secondary | ICD-10-CM

## 2023-06-30 DIAGNOSIS — Z780 Asymptomatic menopausal state: Secondary | ICD-10-CM | POA: Diagnosis not present

## 2023-06-30 DIAGNOSIS — Z23 Encounter for immunization: Secondary | ICD-10-CM

## 2023-06-30 DIAGNOSIS — M2550 Pain in unspecified joint: Secondary | ICD-10-CM | POA: Diagnosis not present

## 2023-06-30 DIAGNOSIS — G5601 Carpal tunnel syndrome, right upper limb: Secondary | ICD-10-CM | POA: Diagnosis not present

## 2023-06-30 DIAGNOSIS — Z1211 Encounter for screening for malignant neoplasm of colon: Secondary | ICD-10-CM

## 2023-06-30 LAB — TSH: TSH: 9.19 u[IU]/mL — ABNORMAL HIGH (ref 0.35–5.50)

## 2023-06-30 LAB — COMPREHENSIVE METABOLIC PANEL WITH GFR
ALT: 12 U/L (ref 0–35)
AST: 14 U/L (ref 0–37)
Albumin: 4.4 g/dL (ref 3.5–5.2)
Alkaline Phosphatase: 72 U/L (ref 39–117)
BUN: 17 mg/dL (ref 6–23)
CO2: 28 meq/L (ref 19–32)
Calcium: 9.4 mg/dL (ref 8.4–10.5)
Chloride: 102 meq/L (ref 96–112)
Creatinine, Ser: 0.78 mg/dL (ref 0.40–1.20)
GFR: 80.03 mL/min (ref >60.00)
Glucose, Bld: 88 mg/dL (ref 70–99)
Potassium: 3.8 meq/L (ref 3.5–5.1)
Sodium: 139 meq/L (ref 135–145)
Total Bilirubin: 0.5 mg/dL (ref 0.2–1.2)
Total Protein: 7.9 g/dL (ref 6.0–8.3)

## 2023-06-30 LAB — C-REACTIVE PROTEIN: CRP: 1.3 mg/dL (ref 0.5–20.0)

## 2023-06-30 LAB — VITAMIN B12: Vitamin B-12: 309 pg/mL (ref 211–911)

## 2023-06-30 LAB — SEDIMENTATION RATE: Sed Rate: 42 mm/h — ABNORMAL HIGH (ref 0–30)

## 2023-06-30 LAB — T4, FREE: Free T4: 0.73 ng/dL (ref 0.60–1.60)

## 2023-06-30 MED ORDER — DICLOFENAC SODIUM 75 MG PO TBEC: 75.0000 mg | DELAYED_RELEASE_TABLET | Freq: Two times a day (BID) | ORAL | 1 refills | Status: DC | PRN

## 2023-06-30 MED ORDER — LEVOTHYROXINE SODIUM 100 MCG PO TABS: 100.0000 ug | ORAL_TABLET | Freq: Every day | ORAL | 2 refills | Status: DC

## 2023-06-30 NOTE — Assessment & Plan Note (Signed)
 We discussed the importance of regular physical activity and healthy diet for prevention of chronic illness and/or complications. Preventive guidelines reviewed. Vaccination updated. Will hold on Prevnar 20 until next visit, when she will be 63. Overdue for cervical cancer screening, if she does not establish with gyne, we can do it next visit. Ca++ and vit D supplementation to continue. Next CPE in a year.

## 2023-06-30 NOTE — Assessment & Plan Note (Signed)
 We discussed diagnosis, prognosis, and treatment options. She did not tolerate duloxetine well. She does not feel the meloxicam is helping, so recommend changing to diclofenac 75 mg twice daily as needed. We discussed some side effects of NSAIDs, recommend taking medication with food. Follow-up in 4 months, before if needed.

## 2023-06-30 NOTE — Patient Instructions (Addendum)
 A few things to remember from today's visit:  Routine general medical examination at a health care facility  Polyarthralgia - Plan: C-reactive protein, Sedimentation rate, diclofenac (VOLTAREN) 75 MG EC tablet  Asymptomatic postmenopausal estrogen deficiency - Plan: DEXAScan  Right carpal tunnel syndrome - Plan: Ambulatory referral to Sports Medicine  Numbness and tingling in right hand - Plan: Comprehensive metabolic panel with GFR, Vitamin B12  Acquired hypothyroidism - Plan: TSH, T4, free  Colon cancer screening - Plan: Cologuard Stop Meloxicam and start Diclofenac for pain, take it with food.  If you need refills for medications you take chronically, please call your pharmacy. Do not use My Chart to request refills or for acute issues that need immediate attention. If you send a my chart message, it may take a few days to be addressed, specially if I am not in the office.  Please be sure medication list is accurate. If a new problem present, please set up appointment sooner than planned today.  Health Maintenance, Female Adopting a healthy lifestyle and getting preventive care are important in promoting health and wellness. Ask your health care provider about: The right schedule for you to have regular tests and exams. Things you can do on your own to prevent diseases and keep yourself healthy. What should I know about diet, weight, and exercise? Eat a healthy diet  Eat a diet that includes plenty of vegetables, fruits, low-fat dairy products, and lean protein. Do not eat a lot of foods that are high in solid fats, added sugars, or sodium. Maintain a healthy weight Body mass index (BMI) is used to identify weight problems. It estimates body fat based on height and weight. Your health care provider can help determine your BMI and help you achieve or maintain a healthy weight. Get regular exercise Get regular exercise. This is one of the most important things you can do for your  health. Most adults should: Exercise for at least 150 minutes each week. The exercise should increase your heart rate and make you sweat (moderate-intensity exercise). Do strengthening exercises at least twice a week. This is in addition to the moderate-intensity exercise. Spend less time sitting. Even light physical activity can be beneficial. Watch cholesterol and blood lipids Have your blood tested for lipids and cholesterol at 65 years of age, then have this test every 5 years. Have your cholesterol levels checked more often if: Your lipid or cholesterol levels are high. You are older than 65 years of age. You are at high risk for heart disease. What should I know about cancer screening? Depending on your health history and family history, you may need to have cancer screening at various ages. This may include screening for: Breast cancer. Cervical cancer. Colorectal cancer. Skin cancer. Lung cancer. What should I know about heart disease, diabetes, and high blood pressure? Blood pressure and heart disease High blood pressure causes heart disease and increases the risk of stroke. This is more likely to develop in people who have high blood pressure readings or are overweight. Have your blood pressure checked: Every 3-5 years if you are 25-63 years of age. Every year if you are 81 years old or older. Diabetes Have regular diabetes screenings. This checks your fasting blood sugar level. Have the screening done: Once every three years after age 63 if you are at a normal weight and have a low risk for diabetes. More often and at a younger age if you are overweight or have a high risk for diabetes.  What should I know about preventing infection? Hepatitis B If you have a higher risk for hepatitis B, you should be screened for this virus. Talk with your health care provider to find out if you are at risk for hepatitis B infection. Hepatitis C Testing is recommended for: Everyone born  from [birth year] through 1967. Anyone with known risk factors for hepatitis C. Sexually transmitted infections (STIs) Get screened for STIs, including gonorrhea and chlamydia, if: You are sexually active and are younger than 65 years of age. You are older than 65 years of age and your health care provider tells you that you are at risk for this type of infection. Your sexual activity has changed since you were last screened, and you are at increased risk for chlamydia or gonorrhea. Ask your health care provider if you are at risk. Ask your health care provider about whether you are at high risk for HIV. Your health care provider may recommend a prescription medicine to help prevent HIV infection. If you choose to take medicine to prevent HIV, you should first get tested for HIV. You should then be tested every 3 months for as long as you are taking the medicine. Pregnancy If you are about to stop having your period (premenopausal) and you may become pregnant, seek counseling before you get pregnant. Take 400 to 800 micrograms (mcg) of folic acid every day if you become pregnant. Ask for birth control (contraception) if you want to prevent pregnancy. Osteoporosis and menopause Osteoporosis is a disease in which the bones lose minerals and strength with aging. This can result in bone fractures. If you are 61 years old or older, or if you are at risk for osteoporosis and fractures, ask your health care provider if you should: Be screened for bone loss. Take a calcium or vitamin D supplement to lower your risk of fractures. Be given hormone replacement therapy (HRT) to treat symptoms of menopause. Follow these instructions at home: Alcohol use Do not drink alcohol if: Your health care provider tells you not to drink. You are pregnant, may be pregnant, or are planning to become pregnant. If you drink alcohol: Limit how much you have to: 0-1 drink a day. Know how much alcohol is in your drink. In the  [place] one drink equals one 12 oz bottle of beer (355 mL), one 5 oz glass of wine (148 mL), or one 1 oz glass of hard liquor (44 mL). Lifestyle Do not use any products that contain nicotine or tobacco. These products include cigarettes, chewing tobacco, and vaping devices, such as e-cigarettes. If you need help quitting, ask your health care provider. Do not use street drugs. Do not share needles. Ask your health care provider for help if you need support or information about quitting drugs. General instructions Schedule regular health, dental, and eye exams. Stay current with your vaccines. Tell your health care provider if: You often feel depressed. You have ever been abused or do not feel safe at home. Summary Adopting a healthy lifestyle and getting preventive care are important in promoting health and wellness. Follow your health care provider's instructions about healthy diet, exercising, and getting tested or screened for diseases. Follow your health care provider's instructions on monitoring your cholesterol and blood pressure. This information is not intended to replace advice given to you by your health care provider. Make sure you discuss any questions you have with your health care provider. Document Revised: 08/05/2020 Document Reviewed: 08/05/2020 Elsevier Patient Education  2024 Elsevier  Inc.

## 2023-06-30 NOTE — Assessment & Plan Note (Signed)
 Last TSH 4.05 in 10/2022, she did not adjust dose as recommended and currently on levothyroxine 88 mcg daily. Further recommendation will be given according to TSH result.

## 2023-07-01 ENCOUNTER — Other Ambulatory Visit: Payer: Self-pay | Admitting: Family Medicine

## 2023-07-01 DIAGNOSIS — G894 Chronic pain syndrome: Secondary | ICD-10-CM

## 2023-07-01 NOTE — Telephone Encounter (Signed)
 Copied from CRM [id number]. Topic: Clinical - Medication Refill >> Jul 01, 2023  9:39 AM [place] J wrote: Most Recent Primary Care Visit:  Provider: [place], BETTY G  Department: [facility]  Visit Type: OFFICE VISIT  Date: 11/03/2022  Medication: traMADol (ULTRAM) 50 MG  Has the patient contacted their pharmacy? No (Agent: If no, request that the patient contact the pharmacy for the refill. If patient does not wish to contact the pharmacy document the reason why and proceed with request.) (Agent: If yes, when and what did the pharmacy advise?) Patient was calling for lab results and remembered that a refill was needed before ever contacting a pharmacy. Is this the correct pharmacy for this prescription? Yes If no, delete pharmacy and type the correct one.  This is the patient's preferred pharmacy:  [facility] [address], [place] - [address] [place] [address] [place] [address] Phone: [phone] Fax: [phone]   Has the prescription been filled recently? No  Is the patient out of the medication? No  Has the patient been seen for an appointment in the last year OR does the patient have an upcoming appointment? Yes  Can we respond through MyChart? No  Agent: Please be advised that Rx refills may take up to 3 business days. We ask that you follow-up with your pharmacy.

## 2023-07-01 NOTE — Telephone Encounter (Signed)
 Copied from CRM [id number]. Topic: Clinical - Lab/Test Results >> Jul 01, 2023  9:34 AM [place] J wrote: Reason for CRM: Relayed test results. Patient was wondering if there's a specific brand of B12 she should buy. She also wanted to make sure that the diclofenac (VOLTAREN) 75 MG was prescribed to help with inflammation.

## 2023-07-02 NOTE — Telephone Encounter (Signed)
 I left pt a message letting her know any B12 is fine & that the diclofenac was sent in.  Rx for Tramadol is expired.

## 2023-07-02 NOTE — Telephone Encounter (Signed)
 Any OTC B12 is fine. It seems like she should have another refill of Tramadol at her pharmacy. Thanks, BJ

## 2023-07-05 ENCOUNTER — Ambulatory Visit (INDEPENDENT_AMBULATORY_CARE_PROVIDER_SITE_OTHER)

## 2023-07-05 ENCOUNTER — Ambulatory Visit (INDEPENDENT_AMBULATORY_CARE_PROVIDER_SITE_OTHER): Admitting: Family Medicine

## 2023-07-05 ENCOUNTER — Other Ambulatory Visit: Payer: Self-pay

## 2023-07-05 VITALS — BP 112/72 | HR 69 | Ht 62.0 in | Wt 205.0 lb

## 2023-07-05 DIAGNOSIS — M79641 Pain in right hand: Secondary | ICD-10-CM

## 2023-07-05 DIAGNOSIS — M19041 Primary osteoarthritis, right hand: Secondary | ICD-10-CM | POA: Diagnosis not present

## 2023-07-05 DIAGNOSIS — M79642 Pain in left hand: Secondary | ICD-10-CM

## 2023-07-05 DIAGNOSIS — G5603 Carpal tunnel syndrome, bilateral upper limbs: Secondary | ICD-10-CM | POA: Diagnosis not present

## 2023-07-05 DIAGNOSIS — M1812 Unilateral primary osteoarthritis of first carpometacarpal joint, left hand: Secondary | ICD-10-CM | POA: Diagnosis not present

## 2023-07-05 MED ORDER — GABAPENTIN 100 MG PO CAPS: 100.0000 mg | ORAL_CAPSULE | Freq: Every day | ORAL | 3 refills | Status: DC

## 2023-07-05 MED ORDER — PREDNISONE 50 MG PO TABS: ORAL_TABLET | ORAL | 0 refills | Status: DC

## 2023-07-05 MED ORDER — TRAMADOL HCL 50 MG PO TABS: ORAL_TABLET | ORAL | 3 refills | Status: DC

## 2023-07-05 NOTE — Progress Notes (Signed)
 Julie Payor, PhD, LAT, ATC acting as a scribe for Julie Graham, MD.  Julie Perry is a 63 y.o. female who presents to [facility] today for bilat wrist and hand pain x a year. She is RHD. Pt locates pain to the ulnar aspect of the distal R forearm w/ swelling present on the 2nd and 4th MCP joints. L thumb, through L wrist and into L forearm w/ triggering present in L thumb.  Additionally she notes some numbness and tingling into the palmar aspect of both hands extending into the 1st through 3rd digit bilaterally.  Radiates: yes Paresthesia: yes- bilat throughout fingers Grip strength: decreased and unable to fully flex fingers on the L Aggravates: at night Treatments tried: wrist brace, tylenol arthritis, tramadol,   Pertinent review of systems: No fevers or chills  Relevant historical information: Chronic low back pain.  GERD.  Hypothyroidism.  Lives near [place].   Exam:  BP 112/72   Pulse 69   Ht 5\' 2"  (1.575 m)   Wt 205 lb (93 kg)   LMP 08/12/2010   SpO2 97%   BMI 37.49 kg/m  General: Well Developed, well nourished, and in no acute distress.   MSK: Hands bilaterally swelling and enlargement of multiple MCPs.  Minimal ulnar deviation is present across MCPs. Wrist swelling is present on the right especially at the ulnar aspect of the wrist.  Normal hand motion. Positive Tinel's and Phalen's test bilateral wrist.    Lab and Radiology Results  Diagnostic Limited MSK Ultrasound of: Right ulnar wrist and carpal tunnel Intact extensor carpi ulnaris tendon with some hypoechoic fluid surrounds the tendon sheath consistent with tenosynovitis. Mildly enlarged median nerve right carpal tunnel. Impression: Extensor carpi ulnaris tenosynovitis and mild carpal tunnel syndrome.   X-ray images bilateral hands obtained today personally and independently interpreted.  Right hand: Osteoarthritis DJD changes multiple locations in the hand especially  at [facility] and DIPs.  No severe MCP erosions.  No acute fractures.  Left hand: Skin osteoarthritis located primarily in PIPs and some and DIPs.  No severe MCP erosions.  No acute fractures.  Await formal radiology review  Recent Results (from the past 2160 hours)  T4, free     Status: None   Collection Time: 06/30/23  9:19 AM  Result Value Ref Range   Free T4 0.73 0.60 - 1.60 ng/dL    Comment: Specimens from patients who are undergoing biotin therapy and /or ingesting biotin supplements may contain high levels of biotin.  The higher biotin concentration in these specimens interferes with this Free T4 assay.  Specimens that contain high levels  of biotin may cause false high results for this Free T4 assay.  Please interpret results in light of the total clinical presentation of the patient.    TSH     Status: Abnormal   Collection Time: 06/30/23  9:19 AM  Result Value Ref Range   TSH 9.19 (H) 0.35 - 5.50 uIU/mL  Sedimentation rate     Status: Abnormal   Collection Time: 06/30/23  9:19 AM  Result Value Ref Range   Sed Rate 42 (H) 0 - 30 mm/hr  C-reactive protein     Status: None   Collection Time: 06/30/23  9:19 AM  Result Value Ref Range   CRP 1.3 0.5 - 20.0 mg/dL  Vitamin Z61     Status: None   Collection Time: 06/30/23  9:19 AM  Result Value Ref Range   Vitamin B-12  309 211 - 911 pg/mL  Comprehensive metabolic panel with GFR     Status: None   Collection Time: 06/30/23  9:19 AM  Result Value Ref Range   Sodium 139 135 - 145 mEq/L   Potassium 3.8 3.5 - 5.1 mEq/L   Chloride 102 96 - 112 mEq/L   CO2 28 19 - 32 mEq/L   Glucose, Bld 88 70 - 99 mg/dL   BUN 17 6 - 23 mg/dL   Creatinine, Ser 1.61 0.40 - 1.20 mg/dL   Total Bilirubin 0.5 0.2 - 1.2 mg/dL   Alkaline Phosphatase 72 39 - 117 U/L   AST 14 0 - 37 U/L   ALT 12 0 - 35 U/L   Total Protein 7.9 6.0 - 8.3 g/dL   Albumin 4.4 3.5 - 5.2 g/dL   GFR 09.60 >45.40 mL/min    Comment: Calculated using the CKD-EPI Creatinine Equation  (2021)   Calcium 9.4 8.4 - 10.5 mg/dL     Assessment and Plan: 63 y.o. female with lateral hand and wrist pain and swelling with bilateral carpal tunnel syndrome.  I think she has multiple coexisting conditions including osteoarthritis in the hand primarily affecting PIPs and DIPs.  Based on physical exam I am concerned for a rheumatologic problem especially with wrist effusion and what looks like synovitis of her MCPs.  Additionally she has right extensor carpi ulnaris tenosynovitis.  Plan for rheumatologic workup.  She has had a partial workup already with PCP that was unrevealing.  Plan for bilateral hand x-ray and further rheumatologic labs below.  Additional refer to hand therapy OT in [place].  Additional prescribed prednisone and recommend carpal tunnel wrist brace and use limited gabapentin.  Recheck in 1 month.   PDMP not reviewed this encounter. Orders Placed This Encounter  Procedures   [place] LIMITED JOINT SPACE STRUCTURES UP BILAT(NO LINKED CHARGES)    Reason for Exam (SYMPTOM  OR DIAGNOSIS REQUIRED):   bilateral hand pain    Preferred imaging location?:   [facility] Hand Complete Left    Standing Status:   Future    Number of Occurrences:   1    Expiration Date:   08/04/2023    Reason for Exam (SYMPTOM  OR DIAGNOSIS REQUIRED):   bilateral hand pain    Preferred imaging location?:   [facility]   DG Hand Complete Right    Standing Status:   Future    Number of Occurrences:   1    Expiration Date:   08/04/2023    Reason for Exam (SYMPTOM  OR DIAGNOSIS REQUIRED):   bilateral hand pain    Preferred imaging location?:    [facility]   ANA    Standing Status:   Future    Number of Occurrences:   1    Expiration Date:   07/04/2024   Cyclic citrul peptide antibody, IgG    Standing Status:   Future    Number of Occurrences:   1    Expiration Date:   07/04/2024   Rheumatoid factor    Standing Status:   Future    Number of Occurrences:    1    Expiration Date:   07/04/2024   Ambulatory referral to Occupational Therapy    Referral Priority:   Routine    Referral Type:   Occupational Therapy    Referral Reason:   Specialty Services Required    Requested Specialty:   Occupational Therapy  Number of Visits Requested:   1   Meds ordered this encounter  Medications   predniSONE (DELTASONE) 50 MG tablet    Sig: Take 1 pill daily for 5 days    Dispense:  5 tablet    Refill:  0   gabapentin (NEURONTIN) 100 MG capsule    Sig: Take 1-3 capsules (100-300 mg total) by mouth at bedtime.    Dispense:  30 capsule    Refill:  3     Discussed warning signs or symptoms. Please see discharge instructions. Patient expresses understanding.   The above documentation has been reviewed and is accurate and complete Julie Perry, M.D.

## 2023-07-05 NOTE — Patient Instructions (Addendum)
 Thank you for coming in today.   Please get an Xray today before you leave   Carpal tunnel wrist braces  Please get labs today before you leave   I've referred you to Hand Therapy.  Let us know if you don't hear from them in one week.   I've sent a prescription for Prednisone and Gabapentin to your pharmacy.   Check back in 1 month

## 2023-07-07 LAB — ANTI-NUCLEAR AB-TITER (ANA TITER): ANA Titer 1: 1:40 {titer} — ABNORMAL HIGH

## 2023-07-07 LAB — CYCLIC CITRUL PEPTIDE ANTIBODY, IGG: Cyclic Citrullin Peptide Ab: >250 U — ABNORMAL HIGH

## 2023-07-07 LAB — ANA: Anti Nuclear Antibody (ANA): POSITIVE — AB

## 2023-07-07 LAB — RHEUMATOID FACTOR: Rheumatoid fact SerPl-aCnc: 44 [IU]/mL — ABNORMAL HIGH (ref <14)

## 2023-07-08 DIAGNOSIS — Z1211 Encounter for screening for malignant neoplasm of colon: Secondary | ICD-10-CM | POA: Diagnosis not present

## 2023-07-09 NOTE — Progress Notes (Signed)
 Left hand x-ray shows arthritis in the thumb.

## 2023-07-09 NOTE — Progress Notes (Signed)
Right hand x-ray shows arthritis in the thumb.

## 2023-07-09 NOTE — Progress Notes (Signed)
 Multiple labs are positive indicating that rheumatoid arthritis is quite likely.  I will refer to rheumatologist.  It will take months to get you in but you should hear soon about scheduling.  You are scheduled to see me on May 7.  Please keep that appointment.  Will talk about this in more detail at that visit as well.

## 2023-07-09 NOTE — Addendum Note (Signed)
 Addended by: Rodolph Bong on: 07/09/2023 07:08 AM   Modules accepted: Orders

## 2023-07-12 ENCOUNTER — Other Ambulatory Visit: Payer: Self-pay

## 2023-07-12 ENCOUNTER — Encounter (HOSPITAL_COMMUNITY): Payer: Self-pay | Admitting: Occupational Therapy

## 2023-07-12 ENCOUNTER — Ambulatory Visit (HOSPITAL_COMMUNITY): Attending: Family Medicine | Admitting: Occupational Therapy

## 2023-07-12 DIAGNOSIS — R29898 Other symptoms and signs involving the musculoskeletal system: Secondary | ICD-10-CM | POA: Diagnosis present

## 2023-07-12 DIAGNOSIS — M25531 Pain in right wrist: Secondary | ICD-10-CM | POA: Diagnosis present

## 2023-07-12 DIAGNOSIS — M79641 Pain in right hand: Secondary | ICD-10-CM | POA: Insufficient documentation

## 2023-07-12 DIAGNOSIS — M25532 Pain in left wrist: Secondary | ICD-10-CM | POA: Insufficient documentation

## 2023-07-12 DIAGNOSIS — M79642 Pain in left hand: Secondary | ICD-10-CM | POA: Diagnosis present

## 2023-07-12 NOTE — Therapy (Signed)
 OUTPATIENT OCCUPATIONAL THERAPY ORTHO EVALUATION  Patient Name: Julie Perry MRN: [id number] DOB:April 04, 1960, 63 y.o., female Today's Date: 07/12/2023    END OF SESSION:  OT End of Session - 07/12/23 1038     Visit Number 1    Number of Visits 2    Date for OT Re-Evaluation 08/02/23    Authorization Type [employer]    Authorization Time Period requesting 1 visit    Authorization - Visit Number 0    Authorization - Number of Visits 1    OT Start Time 0849    OT Stop Time 0925    OT Time Calculation (min) 36 min    Activity Tolerance Patient tolerated treatment well    Behavior During Therapy WFL for tasks assessed/performed             Past Medical History:  Diagnosis Date   GERD (gastroesophageal reflux disease)    Thyroid disease    History reviewed. No pertinent surgical history. Patient Active Problem List   Diagnosis Date Noted   Routine general medical examination at a health care facility 06/30/2023   Urine frequency 11/03/2022   Chronic bilateral low back pain with bilateral sciatica 09/26/2021   Constipation 08/21/2019   Chronic pain disorder 04/20/2018   Insomnia 04/20/2018   Generalized osteoarthritis of multiple sites 01/14/2017   Osteoarthritis of both knees 01/14/2017   Osteoarthritis of facet joint of lumbar spine 03/16/2016   Class 2 obesity with body mass index (BMI) of 38.0 to 38.9 in adult 06/22/2013   Fatigue 01/22/2009   Hypothyroidism 01/20/2007   GERD 01/20/2007   PCP: Dr. Berry [place] REFERRING PROVIDER: Dr. Garlan Juniper  ONSET DATE: Chronic, ~ 1 year  REFERRING DIAG: M79.641,M79.642 (ICD-10-CM) - Bilateral hand pain   THERAPY DIAG:  Pain in left wrist  Pain in right wrist  Pain in left hand  Pain in right hand  Other symptoms and signs involving the musculoskeletal system  Rationale for Evaluation and Treatment: Rehabilitation  SUBJECTIVE:   SUBJECTIVE STATEMENT: S: "It's mainly when I work and at night that it  hurts." Pt accompanied by: self  PERTINENT HISTORY: Pt is a 63 y/o female presenting with bilateral hand and wrist pain that began approximately one year ago and has progressively worsened. The right hand is worse than the left. Pt with hx of arthritis, carpal tunnel.   PRECAUTIONS: None   WEIGHT BEARING RESTRICTIONS: No  PAIN:  Are you having pain? No  FALLS: Has patient fallen in last 6 months? No  PLOF: Independent  PATIENT GOALS: To have less pain  NEXT MD VISIT: 08/04/23  OBJECTIVE:  Note: Objective measures were completed at Evaluation unless otherwise noted.  Per Chart Review:  Lab and Radiology Results   Diagnostic Limited MSK Ultrasound of: Right ulnar wrist and carpal tunnel Intact extensor carpi ulnaris tendon with some hypoechoic fluid surrounds the tendon sheath consistent with tenosynovitis. Mildly enlarged median nerve right carpal tunnel. Impression: Extensor carpi ulnaris tenosynovitis and mild carpal tunnel syndrome.     X-ray images bilateral hands obtained today personally and independently interpreted.   Right hand: Osteoarthritis DJD changes multiple locations in the hand especially at [facility] and DIPs.  No severe MCP erosions.  No acute fractures.   Left hand: Skin osteoarthritis located primarily in PIPs and some and DIPs.  No severe MCP erosions.  No acute fractures.  HAND DOMINANCE: Right  ADLs: Pt reports difficulty with grasping items, washing dishes and cooking. Pt with difficulty holding appliances like  hair dryer or curling iron, requires rest breaks. Pt reports cleaning is difficult, doing dishes, sweeping and vacuuming at times. Computer and mouse use is difficult. Pt with pain at night and sleeping can be difficult as well.    FUNCTIONAL OUTCOME MEASURES: Quick Dash: 34.09  UPPER EXTREMITY ROM:     Active ROM Right eval Left eval  Wrist flexion 38 56  Wrist extension 52 50  Wrist ulnar deviation 34 32  Wrist radial deviation 20 12   Wrist pronation 90 90  Wrist supination 90 90  (Blank rows = not tested)   UPPER EXTREMITY MMT:     MMT Right eval Left eval  Wrist flexion 5/5 5/5  Wrist extension 5/5 5/5  Wrist ulnar deviation 5/5 5/5  Wrist radial deviation 5/5 5/5  Wrist pronation 4+/5 5/5  Wrist supination 4+/5 5/5  (Blank rows = not tested)  HAND FUNCTION: Grip strength: Right: 45 lbs; Left: 46 lbs, Lateral pinch: Right: 11 lbs, Left: 9 lbs, and 3 point pinch: Right: 9 lbs, Left: 9 lbs  SENSATION: Intermittent tingling along digits  EDEMA: Minimal edema noted,   COGNITION: Overall cognitive status: Within functional limits for tasks assessed   TREATMENT DATE:  Eval:  -Discussed wrist splint use, compression glove recommendation                                                                                                                                PATIENT EDUCATION: Education details: tendon glides, red theraputty strengthening Person educated: Patient Education method: Explanation, Demonstration, and Handouts Education comprehension: verbalized understanding and returned demonstration  HOME EXERCISE PROGRAM: Eval: tendon glides, red theraputty strengthening  GOALS: Goals reviewed with patient? Yes  SHORT TERM GOALS: Target date: 07/30/23  Pt will be provided with HEP to improve bilateral wrist and hand use during ADLs, completing independently.   Goal status: INITIAL  2.  Pt will increase bilateral wrist A/ROM by 10 degrees to improve ability to perform work on the computer and using a mouse with less pain.   Goal status: INITIAL  3.  Pt will decrease pain in bilateral wrists and hands to 4/10 or less to improve ability to sleep for 2+ consecutive hours without waking due to pain.  Goal status: INITIAL  4.  Pt will increase bilateral grip strength by 10# and pinch strength by 2# to improve ability to grasp pots and pans during meal preparation.   Goal status: INITIAL  5.   Pt will be educated on AE and DME available to promote joint protection and improve independence with ADLs.   Goal status: INITIAL   ASSESSMENT:  CLINICAL IMPRESSION: Patient is a 63 y.o. female who was seen today for occupational therapy evaluation for bilateral wrist and hand pain present for approximately 1 year and worsening.  Pt has been taking prednisone and gabepentin with significant improvement in her symptoms. Today is the last day of the prednisone. Pt with  good ROM and strength, minimal pain since beginning the medications. Recommend HEP with follow up in 2 weeks to determine if pain is going to return and additional therapy services required. Pt agreeable, verbal cuing for form and technique with HEP.   PERFORMANCE DEFICITS: in functional skills including ADLs, IADLs, coordination, edema, ROM, strength, pain, fascial restrictions, and UE functional use  IMPAIRMENTS: are limiting patient from ADLs, IADLs, rest and sleep, work, and leisure.   COMORBIDITIES: may have co-morbidities  that affects occupational performance. Patient will benefit from skilled OT to address above impairments and improve overall function.  MODIFICATION OR ASSISTANCE TO COMPLETE EVALUATION: No modification of tasks or assist necessary to complete an evaluation.  OT OCCUPATIONAL PROFILE AND HISTORY: Problem focused assessment: Including review of records relating to presenting problem.  CLINICAL DECISION MAKING: LOW - limited treatment options, no task modification necessary  REHAB POTENTIAL: Good  EVALUATION COMPLEXITY: Low      PLAN:  OT FREQUENCY: every other week  OT DURATION: 4 weeks  PLANNED INTERVENTIONS: 97168 OT Re-evaluation, 97535 self care/ADL training, 02725 therapeutic exercise, 97530 therapeutic activity, 97112 neuromuscular re-education, 97140 manual therapy, 97035 ultrasound, 97010 moist heat, 97014 electrical stimulation unattended, patient/family education, and DME and/or AE  instructions  RECOMMENDED OTHER SERVICES: None at this time  CONSULTED AND AGREED WITH PLAN OF CARE: Patient  PLAN FOR NEXT SESSION: Follow up on HEP, reassess, determine pain level with completion of pain medication and determine if additional OT services are required.    Lafonda Piety, OTR/L  [phone] 07/12/2023, 10:39 AM   Managed Medicaid Authorization Request  Visit Dx Codes: Q59.563, M25.531, [id number], M79.641, R29.898  Functional Tool Score: DASH-34.09  For all possible CPT codes, reference the Planned Interventions line above.     Check all conditions that are expected to impact treatment: {Conditions expected to impact treatment:None of these apply   If treatment provided at initial evaluation, no treatment charged due to lack of authorization.

## 2023-07-12 NOTE — Patient Instructions (Signed)
Tendon Gliding Exercises: Complete 10X, hold for 3-5 seconds, 1-2x per day ? ?1) Straight: begin with wrist in extended position and fingers straight ? ? ? ?2) Hook: Bend your fingers making them look like a hook while keeping your thumb straight.  ? ? ? ? ?3) Fist: Make your hand into a fist.  ? ? ? ? ?4) Straight Fist: Bend your fingers straight down into a straight fist. ? ? ? ? ? ?5) Table Top: Straighten your fingers straight out making them look like a table top.  ? ? ? ? ? ? ? ? ? ? ? ?Home Exercises Program ?Theraputty Exercises ? ?Do the following exercises 2 times a day using your affected hand.  ?1. Roll putty into a ball. ? ?2. Make into a pancake. ? ?3. Roll putty into a roll. ? ?4. Pinch along log with first finger and thumb.  ? ?5. Make into a ball. ? ?6. Roll it back into a log.  ? ?7. Pinch using thumb and side of first finger. ? ?8. Roll into a ball, then flatten into a pancake. ? ?9. Using your fingers, make putty into a mountain. ? ?10. Roll putty back into a ball and squeeze gently for 2-3 minutes.  ? ? ? ? ?

## 2023-07-13 LAB — COLOGUARD: COLOGUARD: NEGATIVE

## 2023-08-02 ENCOUNTER — Encounter (HOSPITAL_COMMUNITY): Payer: Self-pay | Admitting: Occupational Therapy

## 2023-08-02 ENCOUNTER — Ambulatory Visit (HOSPITAL_COMMUNITY): Attending: Family Medicine | Admitting: Occupational Therapy

## 2023-08-02 DIAGNOSIS — M79641 Pain in right hand: Secondary | ICD-10-CM | POA: Diagnosis present

## 2023-08-02 DIAGNOSIS — M79642 Pain in left hand: Secondary | ICD-10-CM | POA: Insufficient documentation

## 2023-08-02 DIAGNOSIS — M25531 Pain in right wrist: Secondary | ICD-10-CM | POA: Diagnosis present

## 2023-08-02 DIAGNOSIS — M25532 Pain in left wrist: Secondary | ICD-10-CM | POA: Diagnosis present

## 2023-08-02 DIAGNOSIS — R29898 Other symptoms and signs involving the musculoskeletal system: Secondary | ICD-10-CM | POA: Diagnosis present

## 2023-08-02 NOTE — Therapy (Signed)
 OUTPATIENT OCCUPATIONAL THERAPY ORTHO TREATMENT  Patient Name: Julie Perry MRN: [id number] DOB:[birth year], 63 y.o., female Today's Date: 08/02/2023    END OF SESSION:  OT End of Session - 08/02/23 1303     Visit Number 2    Number of Visits 2    Date for OT Re-Evaluation 08/02/23    Authorization Type [employer]    Authorization Time Period 2 visits approved 4/14-5/14    Authorization - Visit Number 1    Authorization - Number of Visits 2    OT Start Time 1304    OT Stop Time 1346    OT Time Calculation (min) 42 min    Activity Tolerance Patient tolerated treatment well    Behavior During Therapy WFL for tasks assessed/performed              Past Medical History:  Diagnosis Date   GERD (gastroesophageal reflux disease)    Thyroid  disease    History reviewed. No pertinent surgical history. Patient Active Problem List   Diagnosis Date Noted   Routine general medical examination at a health care facility 06/30/2023   Urine frequency 11/03/2022   Chronic bilateral low back pain with bilateral sciatica 09/26/2021   Constipation 08/21/2019   Chronic pain disorder 04/20/2018   Insomnia 04/20/2018   Generalized osteoarthritis of multiple sites 01/14/2017   Osteoarthritis of both knees 01/14/2017   Osteoarthritis of facet joint of lumbar spine 03/16/2016   Class 2 obesity with body mass index (BMI) of 38.0 to 38.9 in adult 06/22/2013   Fatigue 01/22/2009   Hypothyroidism 01/20/2007   GERD 01/20/2007   PCP: Dr. Berry [place] REFERRING PROVIDER: Dr. Garlan Juniper  ONSET DATE: Chronic, ~ 1 year  REFERRING DIAG: M79.641,M79.642 (ICD-10-CM) - Bilateral hand pain   THERAPY DIAG:  Pain in left wrist  Pain in right wrist  Pain in left hand  Pain in right hand  Other symptoms and signs involving the musculoskeletal system  Rationale for Evaluation and Treatment: Rehabilitation  SUBJECTIVE:   SUBJECTIVE STATEMENT: S: "I've had pain in my hands across my  knuckles." (MCPs)  PERTINENT HISTORY: Pt is a 63 y/o female presenting with bilateral hand and wrist pain that began approximately one year ago and has progressively worsened. The right hand is worse than the left. Pt with hx of arthritis, carpal tunnel.   PRECAUTIONS: None   WEIGHT BEARING RESTRICTIONS: No  PAIN:  Are you having pain? No  FALLS: Has patient fallen in last 6 months? No  PLOF: Independent  PATIENT GOALS: To have less pain  NEXT MD VISIT: 08/04/23  OBJECTIVE:  Note: Objective measures were completed at Evaluation unless otherwise noted.  Per Chart Review:  Lab and Radiology Results   Diagnostic Limited MSK Ultrasound of: Right ulnar wrist and carpal tunnel Intact extensor carpi ulnaris tendon with some hypoechoic fluid surrounds the tendon sheath consistent with tenosynovitis. Mildly enlarged median nerve right carpal tunnel. Impression: Extensor carpi ulnaris tenosynovitis and mild carpal tunnel syndrome.     X-ray images bilateral hands obtained today personally and independently interpreted.   Right hand: Osteoarthritis DJD changes multiple locations in the hand especially at [facility] and DIPs.  No severe MCP erosions.  No acute fractures.   Left hand: Skin osteoarthritis located primarily in PIPs and some and DIPs.  No severe MCP erosions.  No acute fractures.  HAND DOMINANCE: Right  ADLs: Pt reports difficulty with grasping items, washing dishes and cooking. Pt with difficulty holding appliances like hair dryer or  curling iron, requires rest breaks. Pt reports cleaning is difficult, doing dishes, sweeping and vacuuming at times. Computer and mouse use is difficult. Pt with pain at night and sleeping can be difficult as well.    FUNCTIONAL OUTCOME MEASURES: Quick Dash: 34.09 08/02/23: 22.73  UPPER EXTREMITY ROM:     Active ROM Right eval Left eval Right 08/02/23 Left 08/02/23  Wrist flexion 38 56 55 55  Wrist extension 52 50 62 68  Wrist ulnar deviation  34 32    Wrist radial deviation 20 12  20   Wrist pronation 90 90    Wrist supination 90 90    (Blank rows = not tested)   UPPER EXTREMITY MMT:     MMT Right eval Left eval Right 08/02/23  Wrist flexion 5/5 5/5   Wrist extension 5/5 5/5   Wrist ulnar deviation 5/5 5/5   Wrist radial deviation 5/5 5/5   Wrist pronation 4+/5 5/5 5/5  Wrist supination 4+/5 5/5 5/5  (Blank rows = not tested)  HAND FUNCTION: Grip strength: Right: 45 lbs; Left: 46 lbs, Lateral pinch: Right: 11 lbs, Left: 9 lbs, and 3 point pinch: Right: 9 lbs, Left: 9 lbs 08/02/23: Grip strength: Right: 46 lbs; Left: 51 lbs, Lateral pinch: Right: 12 lbs, Left: 13 lbs, and 3 point pinch: Right: 10 lbs, Left: 11 lbs  SENSATION: Intermittent tingling along digits  EDEMA: Minimal edema noted,     TREATMENT DATE:  08/02/23 -Discussed AE available for use to promote joint protection during ADLs, provided handout for various items  -weighted/built up utensils  -Rocker knife  -jar/bottle opener  -Electric jar opener   -pill bottle opener  -button hook -Paraffin: bilateral hands/wrists, 10' -Discussed pain management techniques: removing rings at night, wearing compression gloves at night and at work  Eval:  -Discussed wrist splint use, compression glove recommendation                                                                                                                                PATIENT EDUCATION: Education details: AE, dycem, foam to build up utensils Person educated: Patient Education method: Programmer, multimedia, Facilities manager, and Handouts Education comprehension: verbalized understanding and returned demonstration  HOME EXERCISE PROGRAM: Eval: tendon glides, red theraputty strengthening 08/02/23: AE, dycem, foam to build up utensils   GOALS: Goals reviewed with patient? Yes  SHORT TERM GOALS: Target date: 07/30/23  Pt will be provided with HEP to improve bilateral wrist and hand use during ADLs,  completing independently.   Goal status: IN PROGRESS  2.  Pt will increase bilateral wrist A/ROM by 10 degrees to improve ability to perform work on the computer and using a mouse with less pain.   Goal status:  MET  3.  Pt will decrease pain in bilateral wrists and hands to 4/10 or less to improve ability to sleep for 2+ consecutive hours without waking due to pain.  Goal status:  PARTIALLY MET  4.  Pt will increase bilateral grip strength by 10# and pinch strength by 2# to improve ability to grasp pots and pans during meal preparation.   Goal status:  IN PROGRESS  5.  Pt will be educated on AE and DME available to promote joint protection and improve independence with ADLs.   Goal status:  MET   ASSESSMENT:  CLINICAL IMPRESSION: Pt reports her wrists have continued to feel good, but her MCPs are very sore and at times painful since she has tapered off of the prednisone . Pt educated on AE and joint protection strategies today. Reassessment completed for MD follow up on Wednesday. Pt is demonstrating improved ROM in bilateral wrists and strength has improved in RUE. Pt is maintaining grip, has improved pinch strength a bit. She has met 2 goals, pain goal is partially met as wrist pain has improved however hands continue to have pain. Provided XS compression gloves to try at night and at work, recommend pt remove rings at night and when hands swell. Pt to return to MD on Wednesday, will call if MD wants her to complete an additional therapy visit.    PERFORMANCE DEFICITS: in functional skills including ADLs, IADLs, coordination, edema, ROM, strength, pain, fascial restrictions, and UE functional use       PLAN:  OT FREQUENCY: every other week  OT DURATION: 4 weeks  PLANNED INTERVENTIONS: 97168 OT Re-evaluation, 97535 self care/ADL training, 19147 therapeutic exercise, 97530 therapeutic activity, 97112 neuromuscular re-education, 97140 manual therapy, 97035 ultrasound, 97010  moist heat, 97014 electrical stimulation unattended, patient/family education, and DME and/or AE instructions  CONSULTED AND AGREED WITH PLAN OF CARE: Patient  PLAN FOR NEXT SESSION: Follow up on MD appt, AE use, glove use; grip strengthening   Lafonda Piety, OTR/L  [phone] 08/02/2023, 2:14 PM

## 2023-08-04 ENCOUNTER — Encounter: Payer: Self-pay | Admitting: Family Medicine

## 2023-08-04 ENCOUNTER — Telehealth: Payer: Self-pay | Admitting: Family Medicine

## 2023-08-04 ENCOUNTER — Ambulatory Visit: Admitting: Family Medicine

## 2023-08-04 VITALS — BP 122/82 | HR 77 | Ht 62.0 in | Wt 200.0 lb

## 2023-08-04 DIAGNOSIS — M79641 Pain in right hand: Secondary | ICD-10-CM

## 2023-08-04 DIAGNOSIS — M05742 Rheumatoid arthritis with rheumatoid factor of left hand without organ or systems involvement: Secondary | ICD-10-CM

## 2023-08-04 DIAGNOSIS — M05741 Rheumatoid arthritis with rheumatoid factor of right hand without organ or systems involvement: Secondary | ICD-10-CM

## 2023-08-04 DIAGNOSIS — M069 Rheumatoid arthritis, unspecified: Secondary | ICD-10-CM | POA: Insufficient documentation

## 2023-08-04 NOTE — Telephone Encounter (Signed)
 Patient called the rheumatologist and she can not get an appointment until October 2nd at 9:00. She just wanted to let Dr. Alease Hunter know.

## 2023-08-04 NOTE — Telephone Encounter (Signed)
 Forwarding to Dr. Denyse Amass as Lorain Childes.

## 2023-08-04 NOTE — Patient Instructions (Addendum)
 Reach out to rheumatology to schedule your appointment with Dr. Alv[facility] - Dr[facility] [address] [place], [place] [address] [phone]    Unfortunately, [facility] does not accept your secondary insurance.   See you back as needed.

## 2023-08-04 NOTE — Progress Notes (Signed)
 I, Miquel Amen, CMA acting as a scribe for Garlan Juniper, MD.  Julie Perry is a 63 y.o. female who presents to [facility] at [facility] today for 34-month f/u bilat hand pain. Pt was last seen by Dr. Alease Hunter on 07/05/23 and was referred to OT, completing 2 visits, prescribed prednisone , gabapentin , advised to wear a wrist brace. Later referred to rheum, based on lab findings.  Today, pt reports completing rx for Prednisone , continues taking Gabapentin  1 cap at bedtime. Locates pain to the knuckles, looks bruised at times but denies injury. Swelling present, comes and goes throughout the day. Compliant with wrist braces. Has not been contacted by Dr. Sue Em office about scheduled rheumatology. (Looking at referral notes apparently her office did reach out but was unable to get in touch with the patient).  Dx testing: 07/05/23 R & L hand XR and labs  Pertinent review of systems: Hypothyroidism.  Relevant historical information: Hypothyroidism   Exam:  BP 122/82   Pulse 77   Ht 5\' 2"  (1.575 m)   Wt 200 lb (90.7 kg)   LMP 08/12/2010   SpO2 99%   BMI 36.58 kg/m  General: Well Developed, well nourished, and in no acute distress.   MSK: Hands bilaterally significant synovitis MCPs both hands.  Minimal or trace ulnar deviation.  Normal motion.  Tender palpation.    Lab and Radiology Results  DG Hand Complete Right Result Date: 07/08/2023 CLINICAL DATA:  Bilateral hand pain for 3 months.  No known injury. EXAM: RIGHT HAND - COMPLETE 3+ VIEW COMPARISON:  None Available. FINDINGS: Neutral ulnar variance. Mild thumb carpometacarpal joint space narrowing and peripheral osteophytosis. Moderate thumb interphalangeal joint space narrowing and dorsolateral osteophytosis. Moderate second and third and mild fourth and fifth DIP and mild-to-moderate second and mild third PIP joint space narrowing and peripheral osteophytosis. Minimal degenerative spurring at the distal medial aspect  of the third greater than second and fourth metacarpal heads. No acute fracture or dislocation. No cortical erosion or periostitis. IMPRESSION: Mild-to-moderate osteoarthritis, greatest within the thumb interphalangeal joint and second and third DIP joints. Electronically Signed   By: Bertina Broccoli M.D.   On: 07/08/2023 13:21   DG Hand Complete Left Result Date: 07/08/2023 CLINICAL DATA:  Bilateral hand pain for 3 months.  No known injury. EXAM: LEFT HAND - COMPLETE 3+ VIEW COMPARISON:  None Available. FINDINGS: Neutral ulnar variance. Mild thumb carpometacarpal joint space narrowing and peripheral osteophytosis. Mild-to-moderate thumb interphalangeal joint space narrowing and dorsolateral osteophytosis. Mild second through fifth DIP and second PIP joint space narrowing and peripheral osteophytosis. No acute fracture or dislocation. No cortical erosion. IMPRESSION: Mild-to-moderate thumb interphalangeal and mild thumb carpometacarpal osteoarthritis. Electronically Signed   By: Bertina Broccoli M.D.   On: 07/08/2023 13:20   I, Garlan Juniper, personally (independently) visualized and performed the interpretation of the images attached in this note.  Recent Results (from the past 2160 hours)  T4, free     Status: None   Collection Time: 06/30/23  9:19 AM  Result Value Ref Range   Free T4 0.73 0.60 - 1.60 ng/dL    Comment: Specimens from patients who are undergoing biotin therapy and /or ingesting biotin supplements may contain high levels of biotin.  The higher biotin concentration in these specimens interferes with this Free T4 assay.  Specimens that contain high levels  of biotin may cause false high results for this Free T4 assay.  Please interpret results in light of the total clinical presentation  of the patient.    TSH     Status: Abnormal   Collection Time: 06/30/23  9:19 AM  Result Value Ref Range   TSH 9.19 (H) 0.35 - 5.50 uIU/mL  Sedimentation rate     Status: Abnormal   Collection Time:  06/30/23  9:19 AM  Result Value Ref Range   Sed Rate 42 (H) 0 - 30 mm/hr  C-reactive protein     Status: None   Collection Time: 06/30/23  9:19 AM  Result Value Ref Range   CRP 1.3 0.5 - 20.0 mg/dL  Vitamin W09     Status: None   Collection Time: 06/30/23  9:19 AM  Result Value Ref Range   Vitamin B-12 309 211 - 911 pg/mL  Comprehensive metabolic panel with GFR     Status: None   Collection Time: 06/30/23  9:19 AM  Result Value Ref Range   Sodium 139 135 - 145 mEq/L   Potassium 3.8 3.5 - 5.1 mEq/L   Chloride 102 96 - 112 mEq/L   CO2 28 19 - 32 mEq/L   Glucose, Bld 88 70 - 99 mg/dL   BUN 17 6 - 23 mg/dL   Creatinine, Ser 8.11 0.40 - 1.20 mg/dL   Total Bilirubin 0.5 0.2 - 1.2 mg/dL   Alkaline Phosphatase 72 39 - 117 U/L   AST 14 0 - 37 U/L   ALT 12 0 - 35 U/L   Total Protein 7.9 6.0 - 8.3 g/dL   Albumin 4.4 3.5 - 5.2 g/dL   GFR 91.47 >82.95 mL/min    Comment: Calculated using the CKD-EPI Creatinine Equation (2021)   Calcium 9.4 8.4 - 10.5 mg/dL  Rheumatoid factor     Status: Abnormal   Collection Time: 07/05/23  3:01 PM  Result Value Ref Range   Rheumatoid fact SerPl-aCnc 44 (H) <14 IU/mL  Cyclic citrul peptide antibody, IgG     Status: Abnormal   Collection Time: 07/05/23  3:01 PM  Result Value Ref Range   Cyclic Citrullin Peptide Ab >621 (H) UNITS    Comment: Reference Range Negative:            <20 Weak Positive:       20-39 Moderate Positive:   40-59 Strong Positive:     >59 .   ANA     Status: Abnormal   Collection Time: 07/05/23  3:01 PM  Result Value Ref Range   Anti Nuclear Antibody (ANA) POSITIVE (A) NEGATIVE    Comment: ANA IFA is a first line screen for detecting the presence of up to approximately 150 autoantibodies in various autoimmune diseases. A positive ANA IFA result is suggestive of autoimmune disease and reflexes to titer and pattern. Further laboratory testing may be considered if clinically indicated. . For additional information, please  refer to http://education.[facility].com/faq/FAQ177 (This link is being provided for informational/ educational purposes only.) .   Anti-nuclear ab-titer (ANA titer)     Status: Abnormal   Collection Time: 07/05/23  3:01 PM  Result Value Ref Range   ANA Titer 1 1:40 (H) titer    Comment: A low level ANA titer may be present in pre-clinical autoimmune diseases and normal individuals.                 Reference Range                 <1:40        Negative  1:40-1:80    Low Antibody Level                 >1:80        Elevated Antibody Level .    ANA Pattern 1 Nuclear, Homogeneous (A)     Comment: Homogeneous pattern is associated with systemic lupus erythematosus (SLE), drug-induced lupus and juvenile idiopathic arthritis. . AC-1: Homogeneous . International Consensus on ANA Patterns (SeverTies.uy)   Cologuard     Status: None   Collection Time: 07/08/23  9:43 AM  Result Value Ref Range   COLOGUARD Negative Negative    Comment: NEGATIVE TEST RESULT. A negative Cologuard result indicates a low likelihood that a colorectal cancer (CRC) or advanced adenoma (adenomatous polyps with more advanced pre-malignant features)  is present. The chance that a person with a negative Cologuard test has a colorectal cancer is less than 1 in 1500 (negative predictive value >99.9%) or has an  advanced adenoma is less than  5.3% (negative predictive value 94.7%). These data are based on a prospective cross-sectional study of 10,000 individuals at average risk for colorectal cancer who were screened with both Cologuard and colonoscopy. (Imperiale T. et al, N Engl J Med 2014;370(14):1286-1297) The normal value (reference range) for this assay is negative.  COLOGUARD RE-SCREENING RECOMMENDATION: Periodic colorectal cancer screening is an important part of preventive healthcare for asymptomatic individuals at average risk for colorectal cancer.  Following a  negative Cologuard result, the American Cancer Society and U.S.  Multi-Society Task Force screening guidelines recommend a Cologuard re-screening interval of 3 years.  References: American Cancer Society Guideline for Colorectal Cancer Screening: https://www.cancer.org/cancer/colon-rectal-cancer/detection-diagnosis-staging/acs-recommendations.html.; Rex DK, Boland CR, Dominitz JK, Colorectal Cancer Screening: Recommendations for Physicians and Patients from the U.S. Multi-Society Task Force on Colorectal Cancer Screening , Am J Gastroenterology 2017; 112:1016-1030.  TEST DESCRIPTION: Composite algorithmic analysis of stool DNA-biomarkers with hemoglobin immunoassay.   Quantitative values of individual biomarkers are not reportable and are not associated with individual biomarker result reference ranges. Cologuard is intended for colorectal cancer screening of adults of either sex, 45 years or older, who are at average-risk for colorectal cancer (CRC). Cologuard has been approved for use by the U.S. FDA. The performance of Cologuard was  established in a cross sectional study of average-risk adults aged 50-84. Cologuard performance in patients ages 36 to 62 years was estimated by sub-group analysis of near-age groups. Colonoscopies performed for a positive result may find as the most clinically significant lesion: colorectal cancer [4.0%], advanced adenoma (including sessile serrated polyps greater than or equal to 1cm diameter) [20%] or non- advanced adenoma [31%]; or no colorectal neoplasia [45%]. These estimates are derived from a prospective cross-sectional screening study of 10,000 individuals at average risk for colorectal cancer who were screened with both Cologuard and colonoscopy. (Imperiale T. et al, Ole Berkeley J Med 2014;370(14):1286-1297.) Cologuard may produce a false negative or false positive result (no colorectal cancer or precancerous polyp present at colonoscopy follow up). A negative Cologuard test  result does not guarantee the absence of CRC or advanced adenoma (pre-cancer). The current Cologuard  screening interval is every 3 years. Science writer and U.S. Therapist, music). Cologuard performance data in a 10,000 patient pivotal study using colonoscopy as the reference method can be accessed at the following location: www.exactlabs.com/results. Additional description of the Cologuard test process, warnings and precautions can be found at www.cologuard.com.         Assessment and Plan: 63 y.o. female with bilateral hand pain.  Patient had significantly positive rheumatoid factor CCP and sed rate.  She has physical exam findings consistent with rheumatoid arthritis with MCP synovitis.  She had good response to prednisone  but short lived.  I am trying to refer her to rheumatology as I think ultimately she will require disease modifying antirheumatic's or Biologics which I am not equipped to prescribe safely.  We are exploring all of her rheumatology options regionally that would accept her insurance but I expect it may be a while before she gets an appointment. I provided her with the phone number to Dr. Jory Ng office so that she can call and schedule an appointment.  PDMP not reviewed this encounter. No orders of the defined types were placed in this encounter.  No orders of the defined types were placed in this encounter.    Discussed warning signs or symptoms. Please see discharge instructions. Patient expresses understanding.   The above documentation has been reviewed and is accurate and complete Garlan Juniper, M.D.

## 2023-08-05 NOTE — Addendum Note (Signed)
 Addended by: Charle Congo on: 08/05/2023 02:55 PM   Modules accepted: Orders

## 2023-08-05 NOTE — Telephone Encounter (Signed)
Called pt, left VM to call the office.  

## 2023-08-05 NOTE — Telephone Encounter (Addendum)
 I will reach out to pt to inquire.

## 2023-08-05 NOTE — Telephone Encounter (Signed)
 I cannot find any other rheumatology groups in [place] to see you for this.  Have you tried looking at [facility] or [facility] to see if they can get you in sooner?

## 2023-08-05 NOTE — Telephone Encounter (Signed)
 Pt returned call. She is agreeable to referral to [facility].   Referral placed to [facility] [place] [address] [place], [place] [address] Directions [phone]

## 2023-08-11 ENCOUNTER — Encounter: Payer: Self-pay | Admitting: Family Medicine

## 2023-08-11 ENCOUNTER — Other Ambulatory Visit (INDEPENDENT_AMBULATORY_CARE_PROVIDER_SITE_OTHER)

## 2023-08-11 ENCOUNTER — Ambulatory Visit (INDEPENDENT_AMBULATORY_CARE_PROVIDER_SITE_OTHER): Admitting: Family Medicine

## 2023-08-11 VITALS — BP 126/82 | HR 63 | Temp 98.0°F | Resp 16 | Ht 62.0 in | Wt 202.0 lb

## 2023-08-11 DIAGNOSIS — R350 Frequency of micturition: Secondary | ICD-10-CM | POA: Diagnosis not present

## 2023-08-11 DIAGNOSIS — M05741 Rheumatoid arthritis with rheumatoid factor of right hand without organ or systems involvement: Secondary | ICD-10-CM

## 2023-08-11 DIAGNOSIS — E039 Hypothyroidism, unspecified: Secondary | ICD-10-CM

## 2023-08-11 DIAGNOSIS — M05742 Rheumatoid arthritis with rheumatoid factor of left hand without organ or systems involvement: Secondary | ICD-10-CM

## 2023-08-11 LAB — TSH: TSH: 3.29 u[IU]/mL (ref 0.35–5.50)

## 2023-08-11 MED ORDER — MIRABEGRON ER 25 MG PO TB24: 25.0000 mg | ORAL_TABLET | Freq: Every day | ORAL | 2 refills | Status: DC

## 2023-08-11 NOTE — Assessment & Plan Note (Addendum)
 This is a chronic problem. We discussed possible etiologies as well as pharmacologic treatment options and some side effects. She agrees with try Myrbetriq 25 mg daily. Kegel  and pelvic floor exercises may help and recommended. Adequate fiber intake to avoid constipation.

## 2023-08-11 NOTE — Patient Instructions (Addendum)
 A few things to remember from today's visit:  Urinary frequency - Plan: mirabegron ER (MYRBETRIQ) 25 MG TB24 tablet  Rheumatoid arthritis involving both hands with positive rheumatoid factor (HCC)  Acquired hypothyroidism Myrbetriq daily to help with urination. This exercise may help with frequency.  Tighten and relax the pelvic muscles intermittently during the day. Once you are familiar with exercise try to hold pelvic muscles contraction for about 8-10 seconds.in the beginning you may not be able to hold contraction for more than a second or 2 but eventually you will be able to hold contraction harder and for longer time. Perform  8-12 exercises 3 times per day and daily for 15-20 weeks. You will need to continue exercises indefinitely to have a lasting effect.   If you need refills for medications you take chronically, please call your pharmacy. Do not use My Chart to request refills or for acute issues that need immediate attention. If you send a my chart message, it may take a few days to be addressed, specially if I am not in the office.  Please be sure medication list is accurate. If a new problem present, please set up appointment sooner than planned today.

## 2023-08-11 NOTE — Assessment & Plan Note (Signed)
 Recently diagnosed, positive RF and CCP. She has an appointment with rheumatology is on 10/05/2023. Continue diclofenac  75 mg twice daily as needed.

## 2023-08-11 NOTE — Progress Notes (Unsigned)
 ACUTE VISIT Chief Complaint  Patient presents with   Urinary Frequency        Follow-up   HPI: Julie Perry is a 63 y.o. female with a PMHx significant for GERD, hypothyroidism, chronic pain, OA, and insomnia, among some, who is here today to discuss new medication.  Since her last visit she has seen Dr. Anson Kinnier, as per medicine, she has been diagnosed with rheumatoid arthritis and has an appointment with rheumatologist in 09/2023. Intermittent MCP joint pain, bilateral, with edema and erythema. Diclofenac  75 mg twice daily as needed is helping with the pain.  Hand pain:  Patient complains of frequent hand pain and swelling. She has it often in the morning, and localizes it to the metacarpal phalangeal joints.  No known FMHx of RA.  She has an appointment on 7/8 with a rheumatologist in [facility].   Urinary frequency;  Patient also complains of worsening urinary frequency. She says she has had this problem for years.   Urinary Frequency  This is a chronic problem. The current episode started more than 1 year ago. The problem occurs intermittently. The problem has been unchanged. The patient is experiencing no pain. There has been no fever. She is Not sexually active. There is No history of pyelonephritis. Associated symptoms include frequency. Pertinent negatives include no chills, discharge, flank pain, hematuria, hesitancy, nausea, sweats, urgency or vomiting. She has tried nothing for the symptoms.   She has not taken medication for this problem before.  Not established with gynecology. Her last pap smear was in 2015.  Denies urinary dysuria, urgency, or incontinence.   Hypothyroidism: Currently she is on levothyroxine  100 mcg daily, dose was adjusted due to abnormal TSH.  She has not noted dysphagia, palpitations, abdominal pain, changes in bowel habits, tremor, cold/heat intolerance, or abnormal weight loss.  Lab Results  Component Value Date   TSH 9.19 (H) 06/30/2023    Review of Systems  Constitutional:  Negative for chills.  HENT:  Negative for mouth sores and sore throat.   Respiratory:  Negative for cough and shortness of breath.   Cardiovascular:  Negative for chest pain and leg swelling.  Gastrointestinal:  Negative for nausea and vomiting.  Genitourinary:  Positive for frequency. Negative for flank pain, hematuria, hesitancy and urgency.  Musculoskeletal:  Positive for arthralgias and back pain.  Skin:  Negative for rash.  Neurological:  Negative for syncope, weakness and headaches.  See other pertinent positives and negatives in HPI.  Current Outpatient Medications on File Prior to Visit  Medication Sig Dispense Refill   diclofenac  (VOLTAREN ) 75 MG EC tablet Take 1 tablet (75 mg total) by mouth 2 (two) times daily as needed. 60 tablet 1   gabapentin  (NEURONTIN ) 100 MG capsule Take 1-3 capsules (100-300 mg total) by mouth at bedtime. 30 capsule 3   levothyroxine  (SYNTHROID ) 100 MCG tablet Take 1 tablet (100 mcg total) by mouth daily. 90 tablet 2   traMADol  (ULTRAM ) 50 MG tablet TAKE 2 TABLETS BY MOUTH ONCE DAILY AS NEEDED 60 tablet 3   No current facility-administered medications on file prior to visit.    Past Medical History:  Diagnosis Date   GERD (gastroesophageal reflux disease)    Thyroid  disease    No Known Allergies  Social History   Socioeconomic History   Marital status: Married    Spouse name: Not on file   Number of children: Not on file   Years of education: Not on file   Highest education level:  Not on file  Occupational History   Not on file  Tobacco Use   Smoking status: Former    Types: Cigarettes   Smokeless tobacco: Never  Substance and Sexual Activity   Alcohol use: Yes    Comment: Rarely once a year   Drug use: No   Sexual activity: Not on file  Other Topics Concern   Not on file  Social History Narrative   Work or School: Engineer, technical sales Situation: lives  with son (52 yo)      Spiritual Beliefs: Christian      Lifestyle: no regular exercise, diet is so so            Social Drivers of Corporate investment banker Strain: Not on file  Food Insecurity: Not on file  Transportation Needs: Not on file  Physical Activity: Not on file  Stress: Not on file  Social Connections: Not on file   Vitals:   08/11/23 1108  BP: 126/82  Pulse: 63  Resp: 16  Temp: 98 F (36.7 C)  SpO2: 99%   Body mass index is 36.95 kg/m.  Physical Exam Vitals and nursing note reviewed.  Constitutional:      General: She is not in acute distress.    Appearance: She is well-developed.  HENT:     Head: Normocephalic and atraumatic.     Mouth/Throat:     Mouth: Mucous membranes are moist.     Dentition: Has dentures.  Eyes:     Conjunctiva/sclera: Conjunctivae normal.  Cardiovascular:     Rate and Rhythm: Normal rate and regular rhythm.     Heart sounds: No murmur heard. Pulmonary:     Effort: Pulmonary effort is normal. No respiratory distress.     Breath sounds: Normal breath sounds.  Abdominal:     Palpations: Abdomen is soft. There is no mass.     Tenderness: There is no abdominal tenderness.  Musculoskeletal:     Right lower leg: No edema.     Left lower leg: No edema.     Comments: Mild erythema on her right 5th MCP and 2nd MCP joints.   Skin:    General: Skin is warm.     Findings: No erythema or rash.  Neurological:     General: No focal deficit present.     Mental Status: She is alert and oriented to person, place, and time.     Cranial Nerves: No cranial nerve deficit.     Gait: Gait normal.  Psychiatric:        Mood and Affect: Mood and affect normal.   ASSESSMENT AND PLAN:  [name] was seen today to discuss new medication.  Lab Results  Component Value Date   TSH 3.29 08/11/2023   Urine frequency Assessment & Plan: This is a chronic problem. We discussed possible etiologies as well as pharmacologic treatment options  and some side effects. She agrees with try Myrbetriq 25 mg daily. Kegel  and pelvic floor exercises may help and recommended. Adequate fiber intake to avoid constipation.  Orders: -     Mirabegron ER; Take 1 tablet (25 mg total) by mouth daily.  Dispense: 30 tablet; Refill: 2  Rheumatoid arthritis involving both hands with positive rheumatoid factor (HCC) Assessment & Plan: Recently diagnosed, positive RF and CCP. She has an appointment with rheumatology is on 10/05/2023. Continue diclofenac  75 mg twice daily as needed.   Acquired hypothyroidism Assessment &  Plan: Last TSH 9.1 in 06/2023. Continue levothyroxine  100 mcg daily. Further recommendation will be given according to TSH result.   Health maintenance: Prevnar 20 and Shingrix  can be given next visit. She is overdue for Pap smear, declined today.  Return if symptoms worsen or fail to improve, for keep next appointment.  I, Fritz Jewel Wierda, acting as a scribe for Loc Feinstein [place], MD., have documented all relevant documentation on the behalf of Santosh Petter [place], MD, as directed by  Lenda Baratta [place], MD while in the presence of Kenley Troop [place], MD.   I, Darelle Kings [place], MD, have reviewed all documentation for this visit. The documentation on 08/11/23 for the exam, diagnosis, procedures, and orders are all accurate and complete.  Evolet Salminen G. [place], MD  [facility]. [place] office.

## 2023-08-11 NOTE — Assessment & Plan Note (Signed)
 Last TSH 9.1 in 06/2023. Continue levothyroxine  100 mcg daily. Further recommendation will be given according to TSH result.

## 2023-08-12 ENCOUNTER — Ambulatory Visit: Payer: Self-pay | Admitting: Family Medicine

## 2023-08-12 LAB — URINALYSIS W MICROSCOPIC + REFLEX CULTURE
Bacteria, UA: NONE SEEN /HPF
Bilirubin Urine: NEGATIVE
Glucose, UA: NEGATIVE
Hgb urine dipstick: NEGATIVE
Hyaline Cast: NONE SEEN /LPF
Ketones, ur: NEGATIVE
Leukocyte Esterase: NEGATIVE
Nitrites, Initial: NEGATIVE
Protein, ur: NEGATIVE
RBC / HPF: NONE SEEN /HPF (ref 0–2)
Specific Gravity, Urine: 1.006 (ref 1.001–1.035)
Squamous Epithelial / HPF: NONE SEEN /HPF (ref <=5)
WBC, UA: NONE SEEN /HPF (ref 0–5)
pH: 6 (ref 5.0–8.0)

## 2023-08-12 LAB — NO CULTURE INDICATED

## 2023-08-31 ENCOUNTER — Other Ambulatory Visit: Payer: Self-pay | Admitting: Family Medicine

## 2023-08-31 DIAGNOSIS — M159 Polyosteoarthritis, unspecified: Secondary | ICD-10-CM

## 2023-09-02 ENCOUNTER — Other Ambulatory Visit: Payer: Self-pay | Admitting: Family Medicine

## 2023-09-02 DIAGNOSIS — N6489 Other specified disorders of breast: Secondary | ICD-10-CM

## 2023-09-27 ENCOUNTER — Ambulatory Visit
Admission: RE | Admit: 2023-09-27 | Discharge: 2023-09-27 | Disposition: A | Discharge status: Home / Self Care | Source: Ambulatory Visit | Attending: Family Medicine | Admitting: Family Medicine

## 2023-09-27 ENCOUNTER — Ambulatory Visit

## 2023-09-27 DIAGNOSIS — N6489 Other specified disorders of breast: Secondary | ICD-10-CM

## 2023-09-28 ENCOUNTER — Other Ambulatory Visit (HOSPITAL_BASED_OUTPATIENT_CLINIC_OR_DEPARTMENT_OTHER): Payer: Self-pay

## 2023-09-28 ENCOUNTER — Ambulatory Visit

## 2023-09-28 MED ORDER — SHINGRIX 50 MCG/0.5ML IM SUSR
INTRAMUSCULAR | 0 refills | Status: DC
  Filled 2023-09-28: qty 0.5, 1d supply, fill #0

## 2023-09-29 ENCOUNTER — Other Ambulatory Visit: Payer: Self-pay | Admitting: Family Medicine

## 2023-09-29 DIAGNOSIS — N6489 Other specified disorders of breast: Secondary | ICD-10-CM

## 2023-10-05 DIAGNOSIS — Z79899 Other long term (current) drug therapy: Secondary | ICD-10-CM | POA: Insufficient documentation

## 2023-10-05 DIAGNOSIS — R7681 Abnormal rheumatoid factor and anti-citrullinated protein antibody without rheumatoid arthritis: Secondary | ICD-10-CM | POA: Insufficient documentation

## 2023-10-05 DIAGNOSIS — M0579 Rheumatoid arthritis with rheumatoid factor of multiple sites without organ or systems involvement: Secondary | ICD-10-CM | POA: Insufficient documentation

## 2023-10-26 ENCOUNTER — Encounter (HOSPITAL_COMMUNITY): Payer: Self-pay

## 2023-10-26 ENCOUNTER — Other Ambulatory Visit: Payer: Self-pay

## 2023-10-26 ENCOUNTER — Emergency Department (HOSPITAL_COMMUNITY)

## 2023-10-26 ENCOUNTER — Emergency Department (HOSPITAL_COMMUNITY)
Admission: EM | Admit: 2023-10-26 | Discharge: 2023-10-26 | Disposition: A | Discharge status: Home / Self Care | Source: Ambulatory Visit | Attending: Emergency Medicine | Admitting: Emergency Medicine

## 2023-10-26 DIAGNOSIS — M1712 Unilateral primary osteoarthritis, left knee: Secondary | ICD-10-CM | POA: Insufficient documentation

## 2023-10-26 DIAGNOSIS — M25562 Pain in left knee: Secondary | ICD-10-CM

## 2023-10-26 HISTORY — DX: Rheumatoid arthritis, unspecified: M06.9

## 2023-10-26 MED ORDER — PREDNISONE 10 MG PO TABS: 40.0000 mg | ORAL_TABLET | Freq: Every day | ORAL | 0 refills | Status: AC

## 2023-10-26 NOTE — Discharge Instructions (Signed)
 Please take all medications as directed.  Follow-up closely with orthopedics on an outpatient basis.  Return to emergency department immediately for any new or worsening symptoms.

## 2023-10-26 NOTE — ED Provider Notes (Signed)
 [place] EMERGENCY DEPARTMENT AT [facility] Provider Note   CSN: [id number] Arrival date & time: 10/26/23  1407     Patient presents with: Knee Pain   Julie Perry is a 63 y.o. female.   Patient is a 63 year old female who presents emerged part with a chief complaint of left knee pain which has been ongoing for months for the past 2 weeks.  She does admit to some mild swelling but no overlying erythema or warmth.  She denies any recent falls or blunt trauma.  She does admit to a history of previous osteoarthritis and rheumatoid arthritis.  She is currently on hydroxychloroquine at this point.  She notes that she does not see a rheumatologist for approximately the next 3 months.  She denies any numbness or paresthesias.   Knee Pain      Prior to Admission medications   Medication Sig Start Date End Date Taking? Authorizing Provider  predniSONE  (DELTASONE ) 10 MG tablet Take 4 tablets (40 mg total) by mouth daily for 5 days. 10/26/23 10/31/23 Yes Daralene Lonni BIRCH, PA-C  diclofenac  (VOLTAREN ) 75 MG EC tablet Take 1 tablet by mouth twice daily as needed 08/31/23   [place], Betty G, MD  gabapentin  (NEURONTIN ) 100 MG capsule Take 1-3 capsules (100-300 mg total) by mouth at bedtime. 07/05/23   Corey, Evan S, MD  levothyroxine  (SYNTHROID ) 100 MCG tablet Take 1 tablet (100 mcg total) by mouth daily. 06/30/23   [place], Betty G, MD  mirabegron  ER (MYRBETRIQ ) 25 MG TB24 tablet Take 1 tablet (25 mg total) by mouth daily. 08/11/23   [place], Betty G, MD  traMADol  (ULTRAM ) 50 MG tablet TAKE 2 TABLETS BY MOUTH ONCE DAILY AS NEEDED 07/05/23   [place], Betty G, MD  Zoster Vaccine Adjuvanted (SHINGRIX ) injection Inject into the muscle. 09/28/23   Luiz Channel, MD    Allergies: Patient has no known allergies.    Review of Systems  Musculoskeletal:        Left knee pain  All other systems reviewed and are negative.   Updated Vital Signs BP 131/86 (BP Location: Right Arm)   Pulse 62   Temp  98.6 F (37 C) (Oral)   Resp 16   Ht 5' 2 (1.575 m)   Wt 91.6 kg   LMP 08/12/2010   SpO2 98%   BMI 36.94 kg/m   Physical Exam Vitals and nursing note reviewed.  Constitutional:      Appearance: Normal appearance.  HENT:     Head: Normocephalic and atraumatic.  Eyes:     Extraocular Movements: Extraocular movements intact.     Conjunctiva/sclera: Conjunctivae normal.     Pupils: Pupils are equal, round, and reactive to light.  Cardiovascular:     Rate and Rhythm: Normal rate and regular rhythm.     Pulses: Normal pulses.  Pulmonary:     Effort: Pulmonary effort is normal. No respiratory distress.  Musculoskeletal:        General: Normal range of motion.     Comments: Tender to palpation noted over the left knee, nontender outpatient over left hip, ankle, foot, DP and PT pulse are 2+ distally, sensation intact distally, full range of motion noted throughout, no overlying erythema, warmth, edema, no obvious deformity or bruising, no skin breakdown or ulceration, no lacerations or abrasions  Skin:    General: Skin is warm and dry.  Neurological:     General: No focal deficit present.     Mental Status: She is alert and  oriented to person, place, and time. Mental status is at baseline.     (all labs ordered are listed, but only abnormal results are displayed) Labs Reviewed - No data to display  EKG: None  Radiology: DG Knee Complete 4 Views Left Result Date: 10/26/2023 CLINICAL DATA:  Left knee pain. EXAM: LEFT KNEE - COMPLETE 4+ VIEW COMPARISON:  Radiograph dated 01/14/2017. FINDINGS: No acute fracture or dislocation. There is degenerative changes of the knee with moderate narrowing of the medial compartment. No joint effusion. The soft tissues are unremarkable. IMPRESSION: 1. No acute fracture or dislocation. 2. Degenerative changes. Electronically Signed   By: Vanetta Chou M.D.   On: 10/26/2023 15:22     Procedures   Medications Ordered in the ED - No data to  display                                  Medical Decision Making Patient is doing well at this time and is stable for discharge home.  Discussed with patient that we will treat her with a course of steroids at this point.  Suspect osteoarthritis versus flare of her rheumatoid arthritis at this point.  Patient does have stable vital signs with no indication for sepsis.  Physical exam demonstrates no indication for septic joint or gout.  She has neurovascularly intact distally.  Low suspicion for DVT.  The need for close follow-up with primary care doctor was discussed as well as strict turn precautions for any new or worsening symptoms.  Patient voiced understanding and had no additional questions.  Amount and/or Complexity of Data Reviewed Radiology: ordered.  Risk Prescription drug management.        Final diagnoses:  Acute pain of left knee  Osteoarthritis of left knee, unspecified osteoarthritis type    ED Discharge Orders          Ordered    predniSONE  (DELTASONE ) 10 MG tablet  Daily        10/26/23 1550               Daralene Lonni BIRCH, PA-C 10/26/23 1553    Garrick Charleston, MD 10/28/23 1623

## 2023-10-26 NOTE — ED Triage Notes (Signed)
 Pt arrived via POV c/o left knee pain that has been worsening over past few weeks. Pt does endorse Hx of arthritis.

## 2023-10-30 ENCOUNTER — Other Ambulatory Visit: Payer: Self-pay | Admitting: Family Medicine

## 2023-10-30 DIAGNOSIS — R350 Frequency of micturition: Secondary | ICD-10-CM

## 2023-11-02 ENCOUNTER — Ambulatory Visit: Admitting: Family Medicine

## 2023-11-03 ENCOUNTER — Encounter: Payer: Self-pay | Admitting: Orthopedic Surgery

## 2023-11-03 ENCOUNTER — Ambulatory Visit: Admitting: Orthopedic Surgery

## 2023-11-03 ENCOUNTER — Encounter: Payer: Self-pay | Admitting: Family Medicine

## 2023-11-03 VITALS — BP 115/66 | HR 63 | Ht 62.0 in | Wt 194.0 lb

## 2023-11-03 DIAGNOSIS — M1712 Unilateral primary osteoarthritis, left knee: Secondary | ICD-10-CM | POA: Diagnosis not present

## 2023-11-03 NOTE — Patient Instructions (Signed)

## 2023-11-03 NOTE — Progress Notes (Signed)
 New Patient Visit  Assessment: Julie Perry is a 63 y.o. female with the following: Left knee arthritis; also being treated for rheumatoid arthritis  Plan: Julie Perry has pain in the left knee.  This has been bothering her for about a month.  Radiographs demonstrates evidence of osteoarthritis.  She is also being treated for rheumatoid arthritis.  Prednisone  was helpful, but this is worn off.  We discussed multiple treatment options, and she is interested in a steroid injection.  Depending on the efficacy, she may seek authorization for HA injections.  She will contact the clinic if she is interested.  Left knee injection was completed in clinic today.  She will return to clinic as needed.   Procedure note injection Left knee joint   Verbal consent was obtained to inject the left knee joint  Timeout was completed to confirm the site of injection.  The skin was prepped with alcohol and ethyl chloride was sprayed at the injection site.  A 21-gauge needle was used to inject 40 mg of Depo-Medrol  and 1% lidocaine  (4 cc) into the left knee using an anterolateral approach.  There were no complications. A sterile bandage was applied.     Follow-up: Return if symptoms worsen or fail to improve.  Subjective:  Chief Complaint  Patient presents with   Knee Pain    L swelling and pain no injury. States prednisone  helped but pain and swelling is coming back.     History of Present Illness: Julie Perry is a 63 y.o. female who presents for evaluation of left knee pain.  She states that she has had pain in the left knee for the past month.  No specific injury.  She was evaluated the emergency department.  She was given some prednisone .  This helped while she was taking the prednisone , but it has worn off.  Pain is diffuse.  She notes some swelling.  She has had injections in her knees in the past.  She has also been recently diagnosed with rheumatoid arthritis, and a high rheumatoid factor.  She has  started Plaquenil.   Review of Systems: No fevers or chills No numbness or tingling No chest pain No shortness of breath No bowel or bladder dysfunction No GI distress No headaches   Medical History:  Past Medical History:  Diagnosis Date   GERD (gastroesophageal reflux disease)    RA (rheumatoid arthritis) (HCC)    Thyroid  disease     No past surgical history on file.  Family History  Problem Relation Age of Onset   Diabetes Mother    Breast cancer Maternal Aunt    Breast cancer Maternal Aunt    Collagen disease Maternal Grandmother    Collagen disease Other    Broken bones Neg Hx    Social History   Tobacco Use   Smoking status: Former    Types: Cigarettes    Passive exposure: Past   Smokeless tobacco: Never  Vaping Use   Vaping status: Never Used  Substance Use Topics   Alcohol use: Yes    Comment: Rarely once a year   Drug use: No    No Known Allergies  Current Meds  Medication Sig   diclofenac  (VOLTAREN ) 75 MG EC tablet Take 1 tablet by mouth twice daily as needed   gabapentin  (NEURONTIN ) 100 MG capsule Take 1-3 capsules (100-300 mg total) by mouth at bedtime.   levothyroxine  (SYNTHROID ) 100 MCG tablet Take 1 tablet (100 mcg total) by mouth daily.   mirabegron   ER (MYRBETRIQ ) 25 MG TB24 tablet Take 1 tablet by mouth once daily   traMADol  (ULTRAM ) 50 MG tablet TAKE 2 TABLETS BY MOUTH ONCE DAILY AS NEEDED   Zoster Vaccine Adjuvanted (SHINGRIX ) injection Inject into the muscle.    Objective: BP 115/66   Pulse 63   Ht 5' 2 (1.575 m)   Wt 194 lb (88 kg)   LMP 08/12/2010   BMI 35.48 kg/m   Physical Exam:  General: Alert and oriented. and No acute distress. Gait: Left sided antalgic gait.  Left knee with mild varus alignment.  Tenderness palpation along the medial joint line.  No increased laxity to varus or valgus stress.  Range of motion from 0-110 degrees.  She does have some pain with full extension.  Negative Lachman.  IMAGING: I  personally reviewed images previously obtained from the ED  X-rays from the emergency department demonstrates mild varus alignment.  Loss of joint space within the medial compartment.  There are some osteophytes throughout the knee.   New Medications:  No orders of the defined types were placed in this encounter.     Oneil DELENA Horde, MD  11/03/2023 10:52 AM

## 2023-11-11 ENCOUNTER — Other Ambulatory Visit

## 2023-11-12 ENCOUNTER — Ambulatory Visit
Admission: RE | Admit: 2023-11-12 | Discharge: 2023-11-12 | Disposition: A | Discharge status: Home / Self Care | Source: Ambulatory Visit | Attending: Family Medicine | Admitting: Family Medicine

## 2023-11-12 ENCOUNTER — Ambulatory Visit

## 2023-11-12 DIAGNOSIS — N6489 Other specified disorders of breast: Secondary | ICD-10-CM

## 2023-11-16 ENCOUNTER — Encounter: Payer: Self-pay | Admitting: Family Medicine

## 2023-11-16 ENCOUNTER — Other Ambulatory Visit (HOSPITAL_COMMUNITY)
Admission: RE | Admit: 2023-11-16 | Discharge: 2023-11-16 | Disposition: A | Discharge status: Home / Self Care | Source: Ambulatory Visit | Attending: Family Medicine | Admitting: Family Medicine

## 2023-11-16 ENCOUNTER — Ambulatory Visit (INDEPENDENT_AMBULATORY_CARE_PROVIDER_SITE_OTHER): Admitting: Family Medicine

## 2023-11-16 VITALS — BP 128/80 | HR 75 | Resp 16 | Ht 62.0 in | Wt 200.0 lb

## 2023-11-16 DIAGNOSIS — Z124 Encounter for screening for malignant neoplasm of cervix: Secondary | ICD-10-CM | POA: Insufficient documentation

## 2023-11-16 DIAGNOSIS — Z780 Asymptomatic menopausal state: Secondary | ICD-10-CM | POA: Diagnosis not present

## 2023-11-16 DIAGNOSIS — M170 Bilateral primary osteoarthritis of knee: Secondary | ICD-10-CM | POA: Diagnosis not present

## 2023-11-16 DIAGNOSIS — G894 Chronic pain syndrome: Secondary | ICD-10-CM | POA: Diagnosis not present

## 2023-11-16 DIAGNOSIS — Z01419 Encounter for gynecological examination (general) (routine) without abnormal findings: Secondary | ICD-10-CM | POA: Diagnosis present

## 2023-11-16 DIAGNOSIS — Z1151 Encounter for screening for human papillomavirus (HPV): Secondary | ICD-10-CM | POA: Insufficient documentation

## 2023-11-16 DIAGNOSIS — Z23 Encounter for immunization: Secondary | ICD-10-CM

## 2023-11-16 DIAGNOSIS — M05741 Rheumatoid arthritis with rheumatoid factor of right hand without organ or systems involvement: Secondary | ICD-10-CM

## 2023-11-16 DIAGNOSIS — R350 Frequency of micturition: Secondary | ICD-10-CM

## 2023-11-16 DIAGNOSIS — M05742 Rheumatoid arthritis with rheumatoid factor of left hand without organ or systems involvement: Secondary | ICD-10-CM

## 2023-11-16 MED ORDER — TRAMADOL HCL 50 MG PO TABS: ORAL_TABLET | ORAL | 1 refills | Status: AC

## 2023-11-16 NOTE — Assessment & Plan Note (Signed)
 She is still taking tramadol  50 mg twice daily as needed, which helps with lower back pain. PDMP reviewed.

## 2023-11-16 NOTE — Progress Notes (Signed)
 Chief Complaint  Patient presents with   Gynecologic Exam   Follow-up   Discussed the use of AI scribe software for clinical note transcription with the patient, who gave verbal consent to proceed.  History of Present Illness Julie Perry is a 63 year old female ith a PMHx significant for GERD, hypothyroidism, chronic pain, OA, and insomnia who presents for a Pap smear and follow-up on her arthritis management. Last seen on 08/11/23 No new issues since her last visit.   Her last Pap smear was over five years ago with no history of abnormal results. She is not sexually active and has no risk factors for sexually transmitted diseases. Her last menstrual period was at age 81, and she began menstruating at age 34. She has had three pregnancies, resulting in three live births. She is not currently taking any hormone therapy.  She is currently taking Levothyroxine  100 mcg daily for hypothyroidism and Plaquenil for her rheumatoid arthritis.  IP and MCP joint pain + edema. Bilateral knee pain and lower back pain sometimes radiated to LLE.  She started Plaquenil on October 05, 2023, which she doe snot feel has helped. She has experienced significant left knee pain, leading her to visit the emergency room on October 26, 2023, where she was given prednisone , which helped alleviate the pain in both her knee and hands. No current hand pain or swelling in the last three weeks.  Evaluated by ortho on 11/03/23, She received an intra articular steroid injection in her knee, which has provided some relief.  She was on Diclofenac  75 mg for knee pain, wonders if she can resume it.  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  Urinary frequency: Reports great improvement since she started Myrbetriq  25 mg daily.  Review of Systems  Constitutional:  Positive for fatigue.  Negative for activity change, appetite change and fever.  HENT:  Negative for mouth sores and sore throat.   Respiratory:  Negative for cough and shortness of breath.   Cardiovascular:  Negative for chest pain and leg swelling.  Gastrointestinal:  Negative for abdominal pain, nausea and vomiting.  Endocrine: Negative for cold intolerance and heat intolerance.  Genitourinary:  Negative for decreased urine volume, difficulty urinating, dysuria and hematuria.  Musculoskeletal:  Positive for arthralgias and back pain.  Skin:  Negative for rash.  Neurological:  Negative for weakness and headaches.  Psychiatric/Behavioral:  Negative for confusion and hallucinations.   See other pertinent positives and negatives in HPI.  Current Outpatient Medications on File Prior to Visit  Medication Sig Dispense Refill   diclofenac  (VOLTAREN ) 75 MG EC tablet Take 1 tablet by mouth twice daily as needed 60 tablet 1   gabapentin  (NEURONTIN ) 100 MG capsule Take 1-3 capsules (100-300 mg total) by mouth at bedtime. 30 capsule 3   levothyroxine  (SYNTHROID ) 100 MCG tablet Take 1 tablet (100 mcg total) by mouth daily. 90 tablet 2   mirabegron  ER (MYRBETRIQ ) 25 MG TB24 tablet Take 1 tablet by mouth once daily 30 tablet 0   Zoster Vaccine Adjuvanted (SHINGRIX ) injection Inject into the muscle. 0.5 mL 0   No current facility-administered medications on file prior to visit.    Past Medical History:  Diagnosis Date   GERD (gastroesophageal reflux disease)    RA (rheumatoid arthritis) (HCC)    Thyroid  disease    No  Known Allergies  Social History   Socioeconomic History   Marital status: Married    Spouse name: Not on file   Number of children: Not on file   Years of education: Not on file   Highest education level: Not on file  Occupational History   Not on file  Tobacco Use   Smoking status: Former    Types: Cigarettes    Passive exposure: Past   Smokeless tobacco: Never  Vaping Use   Vaping status:  Never Used  Substance and Sexual Activity   Alcohol use: Yes    Comment: Rarely once a year   Drug use: No   Sexual activity: Not on file  Other Topics Concern   Not on file  Social History Narrative   Work or School: Engineer, technical sales Situation: lives with son (33 yo)      Spiritual Beliefs: Christian      Lifestyle: no regular exercise, diet is so so            Social Drivers of Corporate investment banker Strain: Not on file  Food Insecurity: Not on file  Transportation Needs: Not on file  Physical Activity: Not on file  Stress: Not on file  Social Connections: Not on file    Vitals:   11/16/23 1426  BP: 128/80  Pulse: 75  Resp: 16  SpO2: 98%   Wt Readings from Last 3 Encounters:  11/16/23 200 lb (90.7 kg)  11/03/23 194 lb (88 kg)  10/26/23 201 lb 15.1 oz (91.6 kg)   Body mass index is 36.58 kg/m.  Physical Exam Vitals and nursing note reviewed. Exam conducted with a chaperone present.  Constitutional:      General: She is not in acute distress.    Appearance: She is well-developed.  HENT:     Head: Normocephalic and atraumatic.  Eyes:     Conjunctiva/sclera: Conjunctivae normal.  Cardiovascular:     Rate and Rhythm: Normal rate and regular rhythm.     Pulses:          Dorsalis pedis pulses are 2+ on the right side and 2+ on the left side.     Heart sounds: No murmur heard. Pulmonary:     Effort: Pulmonary effort is normal. No respiratory distress.     Breath sounds: Normal breath sounds.  Abdominal:     Palpations: Abdomen is soft. There is no mass.     Tenderness: There is no abdominal tenderness.  Genitourinary:    Exam position: Lithotomy position.     Labia:        Right: No rash, tenderness or lesion.        Left: No rash, tenderness or lesion.      Cervix: No cervical motion tenderness, discharge, friability, lesion or erythema.     Uterus: Not enlarged and not tender.      Adnexa:        Right:  No mass, tenderness or fullness.         Left: No mass, tenderness or fullness.    Musculoskeletal:     Lumbar back: No tenderness or bony tenderness. Negative right straight leg raise test and negative left straight leg raise test.  Lymphadenopathy:     Cervical: No cervical adenopathy.     Lower Body: No right inguinal adenopathy. No left inguinal adenopathy.  Skin:    General: Skin is warm.  Findings: No erythema or rash.  Neurological:     General: No focal deficit present.     Mental Status: She is alert and oriented to person, place, and time.     Cranial Nerves: No cranial nerve deficit.     Gait: Gait normal.  Psychiatric:        Mood and Affect: Mood and affect normal.    ASSESSMENT AND PLAN: Ms. Blayklee Mable was seen today for a chronic disease management.  Orders Placed This Encounter  Procedures   DG Bone Density   Pneumococcal conjugate vaccine 20-valent (Prevnar 20)   Osteoarthritis of both knees, unspecified osteoarthritis type Assessment & Plan: Following with orthopedics. Recently received an intra-articular left knee injection, which helped some. Planning on getting hyaluronic acid injections.   Screening for cervical cancer -     Cytology - PAP  Chronic pain disorder Assessment & Plan: She is still taking tramadol  50 mg twice daily as needed, which helps with lower back pain. PDMP reviewed.  Orders: -     traMADol  HCl; TAKE 2 TABLETS BY MOUTH ONCE DAILY AS NEEDED  Dispense: 60 tablet; Refill: 1  Asymptomatic postmenopausal estrogen deficiency -     DG Bone Density; Future  Rheumatoid arthritis involving both hands with positive rheumatoid factor (HCC) Assessment & Plan: Currently on Plaquenil, which she reports is not helping. Following with rheumatology regularly. Overdue for eye exam.   Urine frequency Assessment & Plan: Problem has improved since she started Myrbetriq  25 mg daily, no changes today.   Need for pneumococcal  vaccination -     Pneumococcal conjugate vaccine 20-valent  Morbid obesity (HCC) Assessment & Plan: Wt stable overall. Consistency with healthy diet and physical activity encouraged.   Return if symptoms worsen or fail to improve, for keep next appointment.  I, Vernell Forest, acting as a scribe for Caroline Matters [place], MD., have documented all relevant documentation on the behalf of Julie Yurko [place], MD, as directed by   while in the presence of Jakeisha Stricker [place], MD.  I, Razia Screws [place], MD, have reviewed all documentation for this visit. The documentation on 11/18/23 for the exam, diagnosis, procedures, and orders are all accurate and complete.  Wood Novacek G. [place], MD  [facility]

## 2023-11-16 NOTE — Assessment & Plan Note (Signed)
 Currently on Plaquenil, which she reports is not helping. Following with rheumatology regularly. Overdue for eye exam.

## 2023-11-16 NOTE — Patient Instructions (Addendum)
 A few things to remember from today's visit:  Osteoarthritis of left knee, unspecified osteoarthritis type  Screening for cervical cancer - Plan: PAP [[place]]  Asymptomatic postmenopausal estrogen deficiency - Plan: DG Bone Density  Chronic pain disorder  Rheumatoid arthritis involving both hands with positive rheumatoid factor (HCC)  Continue Diclofenac  daily as needed with food.  If you need refills for medications you take chronically, please call your pharmacy. Do not use My Chart to request refills or for acute issues that need immediate attention. If you send a my chart message, it may take a few days to be addressed, specially if I am not in the office.  Please be sure medication list is accurate. If a new problem present, please set up appointment sooner than planned today.

## 2023-11-16 NOTE — Assessment & Plan Note (Signed)
 Problem has improved since she started Myrbetriq  25 mg daily, no changes today.

## 2023-11-16 NOTE — Assessment & Plan Note (Signed)
 Following with orthopedics. Recently received an intra-articular left knee injection, which helped some. Planning on getting hyaluronic acid injections.

## 2023-11-17 LAB — CYTOLOGY - PAP
Comment: NEGATIVE
Diagnosis: NEGATIVE
High risk HPV: NEGATIVE

## 2023-11-18 ENCOUNTER — Encounter: Payer: Self-pay | Admitting: Family Medicine

## 2023-11-18 ENCOUNTER — Ambulatory Visit: Payer: Self-pay | Admitting: Family Medicine

## 2023-11-18 NOTE — Assessment & Plan Note (Addendum)
 Wt stable overall. Consistency with healthy diet and physical activity encouraged.

## 2023-11-28 ENCOUNTER — Other Ambulatory Visit: Payer: Self-pay | Admitting: Family Medicine

## 2023-11-28 DIAGNOSIS — R350 Frequency of micturition: Secondary | ICD-10-CM

## 2023-12-16 NOTE — Progress Notes (Deleted)
 Office Visit Note  Patient: Julie Perry             Date of Birth: [birth year]           MRN: [id number]             PCP: [place], Betty G, MD Referring: Joane Artist RAMAN, MD Visit Date: 12/30/2023 Occupation: Data Unavailable  Subjective:  No chief complaint on file.   History of Present Illness: Julie Perry is a 63 y.o. female ***     Activities of Daily Living:  Patient reports morning stiffness for *** {minute/hour:19697}.   Patient {ACTIONS;DENIES/REPORTS:21021675::Denies} nocturnal pain.  Difficulty dressing/grooming: {ACTIONS;DENIES/REPORTS:21021675::Denies} Difficulty climbing stairs: {ACTIONS;DENIES/REPORTS:21021675::Denies} Difficulty getting out of chair: {ACTIONS;DENIES/REPORTS:21021675::Denies} Difficulty using hands for taps, buttons, cutlery, and/or writing: {ACTIONS;DENIES/REPORTS:21021675::Denies}  No Rheumatology ROS completed.   PMFS History:  Patient Active Problem List   Diagnosis Date Noted   Rheumatoid arthritis (HCC) 08/04/2023   Routine general medical examination at a health care facility 06/30/2023   Urine frequency 11/03/2022   Chronic bilateral low back pain with bilateral sciatica 09/26/2021   Constipation 08/21/2019   Chronic pain disorder 04/20/2018   Insomnia 04/20/2018   Generalized osteoarthritis of multiple sites 01/14/2017   Osteoarthritis of both knees 01/14/2017   Osteoarthritis of facet joint of lumbar spine 03/16/2016   Morbid obesity (HCC) with BMI 36 and knee OA,RA,GERD 06/22/2013   Fatigue 01/22/2009   Hypothyroidism 01/20/2007   GERD 01/20/2007    Past Medical History:  Diagnosis Date   GERD (gastroesophageal reflux disease)    RA (rheumatoid arthritis) (HCC)    Thyroid  disease     Family History  Problem Relation Age of Onset   Diabetes Mother    Breast cancer Maternal Aunt    Breast cancer Maternal Aunt    Collagen disease Maternal Grandmother    Collagen disease Other    Broken bones Neg Hx    No past  surgical history on file. 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 (38 yo)      Spiritual Beliefs: Christian      Lifestyle: no regular exercise, diet is so so              Immunization History  Administered Date(s) Administered   Dtap, Unspecified 12/29/1961, 02/03/1962, 03/03/1962, 01/22/1967, 09/22/1971   PFIZER(Purple Top)SARS-COV-2 Vaccination 06/29/2019, 07/21/2019   PNEUMOCOCCAL CONJUGATE-20 11/16/2023   Polio, Unspecified 02/09/1963, 01/22/1967, 09/22/1971   Smallpox 04/21/1962   Tdap 06/30/2023   Zoster Recombinant(Shingrix ) 06/30/2023, 09/28/2023     Objective: Vital Signs: LMP 08/12/2010    Physical Exam   Musculoskeletal Exam: ***  CDAI Exam: CDAI Score: -- Patient Global: --; Provider Global: -- Swollen: --; Tender: -- Joint Exam 12/30/2023   No joint exam has been documented for this visit   There is currently no information documented on the homunculus. Go to the Rheumatology activity and complete the homunculus joint exam.  Investigation: No additional findings.  Imaging: No results found.  Recent Labs: Lab Results  Component Value Date  WBC 7.2 11/03/2022   HGB 13.3 11/03/2022   PLT 207.0 11/03/2022   NA 139 06/30/2023   K 3.8 06/30/2023   CL 102 06/30/2023   CO2 28 06/30/2023   GLUCOSE 88 06/30/2023   BUN 17 06/30/2023   CREATININE 0.78 06/30/2023   BILITOT 0.5 06/30/2023   ALKPHOS 72 06/30/2023   AST 14 06/30/2023   ALT 12 06/30/2023   PROT 7.9 06/30/2023   ALBUMIN 4.4 06/30/2023   CALCIUM 9.4 06/30/2023    Speciality Comments: No specialty comments  available.  Procedures:  No procedures performed Allergies: Patient has no known allergies.   Assessment / Plan:     Visit Diagnoses: No diagnosis found.  Orders: No orders of the defined types were placed in this encounter.  No orders of the defined types were placed in this encounter.   Face-to-face time spent with patient was *** minutes. Greater than 50% of time was spent in counseling and coordination of care.  Follow-Up Instructions: No follow-ups on file.   Maya Nash, MD  Note - This record has been created using Animal nutritionist.  Chart creation errors have been sought, but may not always  have been located. Such creation errors do not reflect on  the standard of medical care.

## 2023-12-17 ENCOUNTER — Telehealth: Payer: Self-pay

## 2023-12-17 NOTE — Telephone Encounter (Signed)
 LMOM to confirm if patient needed that NPT appointment on 12/30/2023 since she has seen another provider.

## 2023-12-17 NOTE — Telephone Encounter (Signed)
 Patient contacted the office back. Appointments cancelled.

## 2023-12-17 NOTE — Telephone Encounter (Signed)
Addressed in another telephone encounter.

## 2023-12-29 ENCOUNTER — Telehealth: Payer: Self-pay | Admitting: Orthopedic Surgery

## 2023-12-29 DIAGNOSIS — M1712 Unilateral primary osteoarthritis, left knee: Secondary | ICD-10-CM

## 2023-12-29 NOTE — Telephone Encounter (Signed)
 Dr. Onesimo pt - spoke w/the pt, she stated that she was here on 11/03/23 and got an injection.  She stated she is in a lot of pain and wants to pursue gel injections.  [phone]

## 2023-12-30 ENCOUNTER — Encounter: Admitting: Rheumatology

## 2024-01-04 ENCOUNTER — Ambulatory Visit: Admitting: Orthopedic Surgery

## 2024-01-04 ENCOUNTER — Encounter: Payer: Self-pay | Admitting: Orthopedic Surgery

## 2024-01-04 VITALS — BP 128/80 | Ht 62.0 in | Wt 200.0 lb

## 2024-01-04 DIAGNOSIS — M1712 Unilateral primary osteoarthritis, left knee: Secondary | ICD-10-CM

## 2024-01-04 DIAGNOSIS — M8440XA Pathological fracture, unspecified site, initial encounter for fracture: Secondary | ICD-10-CM

## 2024-01-04 NOTE — Progress Notes (Unsigned)
 New Patient Visit  Assessment: Julie Perry is a 63 y.o. female with the following: Left knee arthritis; also being treated for rheumatoid arthritis  Plan: Julie Perry has pain in the left knee.  This has been bothering her for about a month.  Radiographs demonstrates evidence of osteoarthritis.  She is also being treated for rheumatoid arthritis.  Prednisone  was helpful, but this is worn off.  We discussed multiple treatment options, and she is interested in a steroid injection.  Depending on the efficacy, she may seek authorization for HA injections.  She will contact the clinic if she is interested.  Left knee injection was completed in clinic today.  She will return to clinic as needed.   Procedure note injection Left knee joint   Verbal consent was obtained to inject the left knee joint  Timeout was completed to confirm the site of injection.  The skin was prepped with alcohol and ethyl chloride was sprayed at the injection site.  A 21-gauge needle was used to inject 40 mg of Depo-Medrol  and 1% lidocaine  (4 cc) into the left knee using an anterolateral approach.  There were no complications. A sterile bandage was applied.     Follow-up: No follow-ups on file.  Subjective:  Chief Complaint  Patient presents with   Knee Pain    LEFT / no better from last visit     History of Present Illness: Julie Perry is a 63 y.o. female who presents for evaluation of left knee pain.  She states that she has had pain in the left knee for the past month.  No specific injury.  She was evaluated the emergency department.  She was given some prednisone .  This helped while she was taking the prednisone , but it has worn off.  Pain is diffuse.  She notes some swelling.  She has had injections in her knees in the past.  She has also been recently diagnosed with rheumatoid arthritis, and a high rheumatoid factor.  She has started Plaquenil.   Review of Systems: No fevers or chills No numbness or  tingling No chest pain No shortness of breath No bowel or bladder dysfunction No GI distress No headaches   Medical History:  Past Medical History:  Diagnosis Date   GERD (gastroesophageal reflux disease)    RA (rheumatoid arthritis) (HCC)    Thyroid  disease     History reviewed. No pertinent surgical history.  Family History  Problem Relation Age of Onset   Diabetes Mother    Breast cancer Maternal Aunt    Breast cancer Maternal Aunt    Collagen disease Maternal Grandmother    Collagen disease Other    Broken bones Neg Hx    Social History   Tobacco Use   Smoking status: Former    Types: Cigarettes    Passive exposure: Past   Smokeless tobacco: Never  Vaping Use   Vaping status: Never Used  Substance Use Topics   Alcohol use: Yes    Comment: Rarely once a year   Drug use: No    No Known Allergies  Current Meds  Medication Sig   hydroxychloroquine (PLAQUENIL) 200 MG tablet Take 200 mg by mouth 2 (two) times daily.    Objective: BP 128/80 Comment: 11/16/23  Ht 5' 2 (1.575 m)   Wt 200 lb (90.7 kg)   LMP 08/12/2010   BMI 36.58 kg/m   Physical Exam:  General: Alert and oriented. and No acute distress. Gait: Left sided antalgic gait.  Left  knee with mild varus alignment.  Tenderness palpation along the medial joint line.  No increased laxity to varus or valgus stress.  Range of motion from 0-110 degrees.  She does have some pain with full extension.  Negative Lachman.  IMAGING: I personally reviewed images previously obtained from the ED  X-rays from the emergency department demonstrates mild varus alignment.  Loss of joint space within the medial compartment.  There are some osteophytes throughout the knee.   New Medications:  No orders of the defined types were placed in this encounter.     Julie DELENA Horde, MD  01/04/2024 3:15 PM

## 2024-01-06 ENCOUNTER — Ambulatory Visit (HOSPITAL_COMMUNITY)
Admission: RE | Admit: 2024-01-06 | Discharge: 2024-01-06 | Disposition: A | Discharge status: Home / Self Care | Source: Ambulatory Visit | Attending: Orthopedic Surgery | Admitting: Orthopedic Surgery

## 2024-01-06 DIAGNOSIS — M1712 Unilateral primary osteoarthritis, left knee: Secondary | ICD-10-CM | POA: Diagnosis present

## 2024-01-12 ENCOUNTER — Ambulatory Visit: Admitting: Orthopedic Surgery

## 2024-01-12 ENCOUNTER — Encounter: Payer: Self-pay | Admitting: Orthopedic Surgery

## 2024-01-12 VITALS — BP 110/73 | HR 62 | Ht 62.0 in | Wt 196.4 lb

## 2024-01-12 DIAGNOSIS — M1712 Unilateral primary osteoarthritis, left knee: Secondary | ICD-10-CM | POA: Diagnosis not present

## 2024-01-12 MED ORDER — PREDNISONE 10 MG (21) PO TBPK: ORAL_TABLET | ORAL | 0 refills | Status: DC

## 2024-01-12 NOTE — Progress Notes (Unsigned)
 Return Patient Visit  Assessment: Julie Perry is a 63 y.o. female with the following: Left knee arthritis; also being treated for rheumatoid arthritis  Horizontal tear of the medial meniscus, without displaceable flap   Plan: Julie Perry continues to have pain in the left knee.  She does have swelling in the left knee.  She is complaining of pains radiating distally.  We reviewed the MRI together, which demonstrates moderate tricompartmental degenerative changes.  In addition, she has a horizontal tear of the medial meniscus.  I do not think the meniscus is contributing to mechanical symptoms.  Current pain could be an arthritic flare.  I do not think that a surgery is warranted.  We discussed the possibility of aspiration of the knee.  This was completed in clinic today.  In addition, she is interested in hyaluronic acid injections.  We will initiate authorization.  She will continue to take tramadol , as well as diclofenac .  We attempted knee aspiration, but the knee was very irritated and she did not tolerate this procedure.  Aspiration was aborted.  She tolerated this without issue.    Follow-up: Return for After Insurance Authorization for Injection.  Subjective:  Chief Complaint  Patient presents with   Knee Pain    L/ Can't hardly walk on it. Having a lot of pain. It makes my whole leg hurt and the pain goes down into my toes.    History of Present Illness: Julie Perry is a 63 y.o. female who returns for evaluation of left knee pain.  Atraumatic onset of pain in the left knee.  This has been ongoing for a couple of months.  Limited improvement following a steroid injection.  She continues to have pain in the left knee, radiating proximally and distally.  She states that she is having pains radiating to the dorsum of the left foot.  She has obtained an MRI, and is here to discuss the findings.  She continues to take tramadol , as well as Tylenol  and diclofenac .  Limited improvement in  her symptoms.  It is affecting her sleep.  Review of Systems: No fevers or chills No numbness or tingling No chest pain No shortness of breath No bowel or bladder dysfunction No GI distress No headaches     Objective: BP 110/73   Pulse 62   Ht 5' 2 (1.575 m)   Wt 196 lb 6 oz (89.1 kg)   LMP 08/12/2010   BMI 35.92 kg/m   Physical Exam:  General: Alert and oriented. and No acute distress. Gait: Left sided antalgic gait.  Left knee with mild varus alignment.  Tenderness palpation along the medial joint line.  No increased laxity to varus or valgus stress.  Range of motion from 0-110 degrees.  She does have some pain with full extension.  Negative Lachman.  Tenderness along the medial tibial plateau, as well as the medial femoral condyle.  IMAGING: I personally ordered and reviewed the following images  Left knee MRI  IMPRESSION: Mild to moderate tricompartmental osteoarthrosis with chondromalacia and marginal osteophytes. There is a moderate reactive joint effusion.   Horizontal tear at the junction the body and posterior horn the medial meniscus without evidence of a displaced meniscal flap.   Ligaments are unremarkable.   New Medications:  No orders of the defined types were placed in this encounter.     Oneil DELENA Horde, MD  01/12/2024 11:56 AM

## 2024-01-26 ENCOUNTER — Telehealth: Payer: Self-pay

## 2024-01-26 ENCOUNTER — Other Ambulatory Visit: Payer: Self-pay | Admitting: Orthopedic Surgery

## 2024-01-26 NOTE — Telephone Encounter (Signed)
 Patient is asking if the gel injection medicine been ordered for her.  Please call and advise [phone]

## 2024-01-27 ENCOUNTER — Ambulatory Visit: Admitting: Rheumatology

## 2024-01-28 NOTE — Telephone Encounter (Signed)
 April,    Checking to see if you've received anything back on this pt yet.

## 2024-01-28 NOTE — Telephone Encounter (Signed)
 Message has been sent to [name] to schedule.

## 2024-02-02 ENCOUNTER — Encounter: Payer: Self-pay | Admitting: Family Medicine

## 2024-02-02 ENCOUNTER — Ambulatory Visit (INDEPENDENT_AMBULATORY_CARE_PROVIDER_SITE_OTHER): Admitting: Family Medicine

## 2024-02-02 ENCOUNTER — Ambulatory Visit: Payer: Self-pay | Admitting: *Deleted

## 2024-02-02 VITALS — BP 114/72 | HR 65 | Temp 98.2°F | Wt 198.6 lb

## 2024-02-02 DIAGNOSIS — R058 Other specified cough: Secondary | ICD-10-CM

## 2024-02-02 MED ORDER — DOXYCYCLINE HYCLATE 100 MG PO CAPS: 100.0000 mg | ORAL_CAPSULE | Freq: Two times a day (BID) | ORAL | 0 refills | Status: AC

## 2024-02-02 NOTE — Patient Instructions (Signed)
 Get back on the prednisone , as discussed  Start the antibiotic and take twice daily.  Follow up for any fever or increased shortness of breath.

## 2024-02-02 NOTE — Telephone Encounter (Signed)
 FYI Only or Action Required?: FYI only for provider: appointment scheduled on 02/02/24.  Patient was last seen in primary care on 11/16/2023 by Jordan, Betty G, MD.  Called Nurse Triage reporting Cough.  Symptoms began a week ago.  Interventions attempted: OTC medications: mucinex.  Symptoms are: gradually worsening.  Triage Disposition: See Physician Within 24 Hours  Patient/caregiver understands and will follow disposition?: Yes  Patient requesting appt today due to she is trying to go back to work tomorrow.     Copied from CRM [id number]. Topic: Clinical - Red Word Triage >> Feb 02, 2024  9:23 AM Joesph NOVAK wrote: Red Word that prompted transfer to Nurse Triage: Patient has a cold. Coughing, sneezing, sore throat. Reason for Disposition  [1] Continuous (nonstop) coughing interferes with work or school AND [2] no improvement using cough treatment per Care Advice  Answer Assessment - Initial Assessment Questions Appt scheduled today with other provider none available with PCP until Friday. Patient requesting cough medication due to worsening constant cough and she is going to see if she feels better to go to work tomorrow night. Patient reports starting to feel very tired. No dizziness reported. Sx since last Monday .       1. ONSET: When did the cough begin?      Last Monday started with sore throat and Thursday started coughing  2. SEVERITY: How bad is the cough today?      Worsening  but does not vomit 3. SPUTUM: Describe the color of your sputum (e.g., none, dry cough; clear, white, yellow, green)     Yellow  4. HEMOPTYSIS: Are you coughing up any blood? If Yes, ask: How much? (e.g., flecks, streaks, tablespoons, etc.)     No  5. DIFFICULTY BREATHING: Are you having difficulty breathing? If Yes, ask: How bad is it? (e.g., mild, moderate, severe)      No  6. FEVER: Do you have a fever? If Yes, ask: What is your temperature, how was it measured, and when did  it start?     No but sweating at times. 7. CARDIAC HISTORY: Do you have any history of heart disease? (e.g., heart attack, congestive heart failure)      No  8. LUNG HISTORY: Do you have any history of lung disease?  (e.g., pulmonary embolus, asthma, emphysema)     No  9. PE RISK FACTORS: Do you have a history of blood clots? (or: recent major surgery, recent prolonged travel, bedridden)     na 10. OTHER SYMPTOMS: Do you have any other symptoms? (e.g., runny nose, wheezing, chest pain)       Talking mucinex , coughing nasal congestion blowing nose, sneezing , sore throat, sleeping feels mucus in chest , headache. No chest pain no difficulty breathing no fever.  11. PREGNANCY: Is there any chance you are pregnant? When was your last menstrual period?       na 12. TRAVEL: Have you traveled out of the country in the last month? (e.g., travel history, exposures)       na  Protocols used: Cough - Acute Productive-A-AH

## 2024-02-02 NOTE — Progress Notes (Signed)
 Established Patient Office Visit  Subjective   Patient ID: Julie Perry, female    DOB: [birth year]  Age: 63 y.o. MRN: [id number]  Chief Complaint  Patient presents with   Cough   Nasal Congestion    HPI   Julie Perry is seen with upper respiratory symptoms.  She states a week ago Monday she had severe sore throat.  This was followed by some nasal congestion and worsening cough for last Thursday.  She had severe cough at times over the weekend.  Cough has been productive of yellow sputum for several days.  She has had some left lower rib pain from coughing.  No fevers or chills.  She feels like she may have some wheezing.  Has taken over-the-counter Mucinex and NyQuil and is getting some relief with sleep with that.  She was prescribed prednisone  by orthopedic specialist for some recent knee pain and plans to start that back later today.  She does apparently take Plaquenil regularly.  Denies any chronic lung disease.  Past Medical History:  Diagnosis Date   GERD (gastroesophageal reflux disease)    RA (rheumatoid arthritis) (HCC)    Thyroid  disease    History reviewed. No pertinent surgical history.  reports that she has quit smoking. Her smoking use included cigarettes. She has been exposed to tobacco smoke. She has never used smokeless tobacco. She reports current alcohol use. She reports that she does not use drugs. family history includes Breast cancer in her maternal aunt and maternal aunt; Collagen disease in her maternal grandmother and another family member; Diabetes in her mother. No Known Allergies  Review of Systems  Constitutional:  Negative for chills and fever.  Respiratory:  Positive for cough, sputum production and wheezing. Negative for hemoptysis.   Cardiovascular:  Negative for chest pain.      Objective:     BP 114/72   Pulse 65   Temp 98.2 F (36.8 C) (Oral)   Wt 198 lb 9.6 oz (90.1 kg)   LMP 08/12/2010   SpO2 96%   BMI 36.32 kg/m  BP Readings from Last 3  Encounters:  02/02/24 114/72  01/12/24 110/73  01/04/24 128/80   Wt Readings from Last 3 Encounters:  02/02/24 198 lb 9.6 oz (90.1 kg)  01/12/24 196 lb 6 oz (89.1 kg)  01/04/24 200 lb (90.7 kg)      Physical Exam Vitals reviewed.  Constitutional:      General: She is not in acute distress.    Appearance: She is not ill-appearing.  HENT:     Right Ear: Tympanic membrane normal.     Left Ear: Tympanic membrane normal.     Mouth/Throat:     Mouth: Mucous membranes are moist.     Pharynx: No oropharyngeal exudate or posterior oropharyngeal erythema.  Cardiovascular:     Rate and Rhythm: Normal rate and regular rhythm.  Pulmonary:     Effort: Pulmonary effort is normal.     Comments: She does have some upper anterior airway wheezing left greater than right.  No rales.  No retractions.  Pulse oximetry 96% room air Neurological:     Mental Status: She is alert.      No results found for any visits on 02/02/24.    The 10-year ASCVD risk score (Arnett DK, et al., 2019) is: 4.2%    Assessment & Plan:   Productive cough with wheezing.  Suspect probably predominantly viral though she does have risk factor of Plaquenil therapy and does have elevated  wheezing on exam today.  No respiratory distress.  We decided to go and cover with doxycycline 100 mg twice daily for 7 days.  She is already on prednisone  per her orthopedist and will start that back today.  Follow-up immediately for any fever or increased shortness of breath  Wolm Scarlet, MD

## 2024-02-08 ENCOUNTER — Encounter: Payer: Self-pay | Admitting: Orthopedic Surgery

## 2024-02-08 ENCOUNTER — Ambulatory Visit: Admitting: Orthopedic Surgery

## 2024-02-08 DIAGNOSIS — M1712 Unilateral primary osteoarthritis, left knee: Secondary | ICD-10-CM | POA: Diagnosis not present

## 2024-02-08 NOTE — Progress Notes (Signed)
 Return Patient Visit  Assessment: Julie Perry is a 63 y.o. female with the following: Left knee arthritis; also being treated for rheumatoid arthritis  Horizontal tear of the medial meniscus, without displaceable flap   Plan: Julie Perry has arthritis in the left knee.  Underlying meniscus tear, without mechanical symptoms.  She is interested in hyaluronic acid injections.  First injection was completed today.  I will see her back in 1 week for her next scheduled injection.  This patient is diagnosed with osteoarthritis of the knee(s).    Radiographs show evidence of joint space narrowing, osteophytes, subchondral sclerosis and/or subchondral cysts.  This patient has knee pain which interferes with functional and activities of daily living.    This patient has experienced inadequate response, adverse effects and/or intolerance with conservative treatments such as acetaminophen , NSAIDS, topical creams, physical therapy or regular exercise, knee bracing and/or weight loss.   This patient has experienced inadequate response or has a contraindication to intra articular steroid injections for at least 3 months.   This patient is not scheduled to have a total knee replacement within 6 months of starting treatment with viscosupplementation.   Procedure note injection Left knee joint   Verbal consent was obtained to inject the right knee joint  Timeout was completed to confirm the site of injection.  The skin was prepped with alcohol and ethyl chloride was sprayed at the injection site.  A 21-gauge needle was used to inject orthovisc hyaluronic acid into the right knee using an anterolateral approach.  There were no complications. A sterile bandage was applied.   Follow-up: No follow-ups on file.  Subjective:  No chief complaint on file.   History of Present Illness: Julie Perry is a 63 y.o. female who returns for evaluation of left knee pain.  She has known arthritis in the left knee.   No specific injury.  Steroid injection with limited improvement in her symptoms.  She is now interested in HA injections. Review of Systems: No fevers or chills No numbness or tingling No chest pain No shortness of breath No bowel or bladder dysfunction No GI distress No headaches     Objective: LMP 08/12/2010   Physical Exam:  General: Alert and oriented. and No acute distress. Gait: Left sided antalgic gait.  Left knee with mild varus alignment.  Tenderness palpation along the medial joint line.  No increased laxity to varus or valgus stress.  Range of motion from 0-110 degrees.  She does have some pain with full extension.  Negative Lachman.  Tenderness along the medial tibial plateau, as well as the medial femoral condyle.  IMAGING: I personally ordered and reviewed the following images  Left knee MRI  IMPRESSION: Mild to moderate tricompartmental osteoarthrosis with chondromalacia and marginal osteophytes. There is a moderate reactive joint effusion.   Horizontal tear at the junction the body and posterior horn the medial meniscus without evidence of a displaced meniscal flap.   Ligaments are unremarkable.   New Medications:  No orders of the defined types were placed in this encounter.     Julie DELENA Horde, MD  02/08/2024 11:41 AM

## 2024-02-08 NOTE — Patient Instructions (Signed)
Instructions Following Joint Injections  In clinic today, you received an injection in one of your joints (sometimes more than one).  Occasionally, you can have some pain at the injection site, this is normal.  You can place ice at the injection site, or take over-the-counter medications such as Tylenol (acetaminophen) or Advil (ibuprofen).  Please follow all directions listed on the bottle.  If your joint (knee or shoulder) becomes swollen, red or very painful, please contact the clinic for additional assistance.   

## 2024-02-15 ENCOUNTER — Ambulatory Visit: Admitting: Orthopedic Surgery

## 2024-02-15 ENCOUNTER — Encounter: Payer: Self-pay | Admitting: Orthopedic Surgery

## 2024-02-15 DIAGNOSIS — M1712 Unilateral primary osteoarthritis, left knee: Secondary | ICD-10-CM | POA: Diagnosis not present

## 2024-02-15 NOTE — Progress Notes (Signed)
 Return Patient Visit  Assessment: Julie Perry is a 63 y.o. female with the following: Left knee arthritis; also being treated for rheumatoid arthritis  Horizontal tear of the medial meniscus, without displaceable flap   Plan: Julie Perry has arthritis in the left knee.  Underlying meniscus tear, without mechanical symptoms.  She is interested in hyaluronic acid injections.  No issues following the first injection.  She is ready to proceed with her second injection.  She would like to skip a week, due to the holiday.  I will see her in 2 weeks for her final injection.   This patient is diagnosed with osteoarthritis of the knee(s).    Radiographs show evidence of joint space narrowing, osteophytes, subchondral sclerosis and/or subchondral cysts.  This patient has knee pain which interferes with functional and activities of daily living.    This patient has experienced inadequate response, adverse effects and/or intolerance with conservative treatments such as acetaminophen , NSAIDS, topical creams, physical therapy or regular exercise, knee bracing and/or weight loss.   This patient has experienced inadequate response or has a contraindication to intra articular steroid injections for at least 3 months.   This patient is not scheduled to have a total knee replacement within 6 months of starting treatment with viscosupplementation.   Procedure note injection Left knee joint   Verbal consent was obtained to inject the right knee joint  Timeout was completed to confirm the site of injection.  The skin was prepped with alcohol and ethyl chloride was sprayed at the injection site.  A 21-gauge needle was used to inject orthovisc hyaluronic acid into the right knee using an anterolateral approach.  There were no complications. A sterile bandage was applied.   Follow-up: Return for reschedule appointment for 2 weeks from today.  Subjective:  Chief Complaint  Patient presents with    Injections    L Knee OrthoVisc #2  NRC: [id number] Lot: [id number] Exp:10/19/25    History of Present Illness: Julie Perry is a 63 y.o. female who returns for evaluation of left knee pain.  She has known arthritis in the left knee.  No specific injury.  She had her first injection of hyaluronic acid last week.  She noted some improvement in her symptoms for a couple of days.  However, she notes some slightly worsening pain today.   Review of Systems: No fevers or chills No numbness or tingling No chest pain No shortness of breath No bowel or bladder dysfunction No GI distress No headaches     Objective: LMP 08/12/2010   Physical Exam:  General: Alert and oriented. and No acute distress. Gait: Left sided antalgic gait.  Left knee with mild varus alignment.  Tenderness palpation along the medial joint line.  No increased laxity to varus or valgus stress.  Range of motion from 0-110 degrees.  She does have some pain with full extension.  Negative Lachman.  Tenderness along the medial tibial plateau, as well as the medial femoral condyle.  IMAGING: I personally ordered and reviewed the following images  Left knee MRI  IMPRESSION: Mild to moderate tricompartmental osteoarthrosis with chondromalacia and marginal osteophytes. There is a moderate reactive joint effusion.   Horizontal tear at the junction the body and posterior horn the medial meniscus without evidence of a displaced meniscal flap.   Ligaments are unremarkable.   New Medications:  No orders of the defined types were placed in this encounter.     Oneil DELENA Horde, MD  02/15/2024 3:31 PM

## 2024-02-15 NOTE — Patient Instructions (Signed)
Instructions Following Joint Injections  In clinic today, you received an injection in one of your joints (sometimes more than one).  Occasionally, you can have some pain at the injection site, this is normal.  You can place ice at the injection site, or take over-the-counter medications such as Tylenol (acetaminophen) or Advil (ibuprofen).  Please follow all directions listed on the bottle.  If your joint (knee or shoulder) becomes swollen, red or very painful, please contact the clinic for additional assistance.   Injections in the same joint cannot be repeated for 3 months.  This helps to limit the risk of an infection in the joint.  If you were to develop an infection in your joint, the best treatment option would be surgery.

## 2024-02-22 ENCOUNTER — Ambulatory Visit: Admitting: Orthopedic Surgery

## 2024-02-29 ENCOUNTER — Ambulatory Visit: Admitting: Orthopedic Surgery

## 2024-03-07 ENCOUNTER — Ambulatory Visit: Admitting: Orthopedic Surgery

## 2024-03-07 ENCOUNTER — Encounter: Payer: Self-pay | Admitting: Orthopedic Surgery

## 2024-03-07 DIAGNOSIS — M1712 Unilateral primary osteoarthritis, left knee: Secondary | ICD-10-CM

## 2024-03-07 NOTE — Progress Notes (Signed)
 Return Patient Visit  Assessment: Julie Perry is a 63 y.o. female with the following: Left knee arthritis; also being treated for rheumatoid arthritis  Horizontal tear of the medial meniscus, without displaceable flap   Plan: [name] has arthritis in the left knee.  Underlying meniscus tear, without mechanical symptoms.  Limited improvement in her symptoms thus far.  She notes worsening radiating pains from her left knee.  Advised her that she can see improvements following the injections for up to 6 weeks after the third injection.  Recommend completion of the series.  Continue with medications as needed.   This patient is diagnosed with osteoarthritis of the knee(s).    Radiographs show evidence of joint space narrowing, osteophytes, subchondral sclerosis and/or subchondral cysts.  This patient has knee pain which interferes with functional and activities of daily living.    This patient has experienced inadequate response, adverse effects and/or intolerance with conservative treatments such as acetaminophen , NSAIDS, topical creams, physical therapy or regular exercise, knee bracing and/or weight loss.   This patient has experienced inadequate response or has a contraindication to intra articular steroid injections for at least 3 months.   This patient is not scheduled to have a total knee replacement within 6 months of starting treatment with viscosupplementation.   Procedure note injection Left knee joint   Verbal consent was obtained to inject the right knee joint  Timeout was completed to confirm the site of injection.  The skin was prepped with alcohol and ethyl chloride was sprayed at the injection site.  A 21-gauge needle was used to inject orthovisc hyaluronic acid into the right knee using an anterolateral approach.  There were no complications. A sterile bandage was applied.   Follow-up: Return if symptoms worsen or fail to improve.  Subjective:  Chief Complaint   Patient presents with   Injections    Left knee orthovisc #3 thinks pain is coming from her back she has left thigh pain / no improvement with left knee pain     History of Present Illness: Julie Perry is a 63 y.o. female who returns for evaluation of left knee pain.  She has known arthritis in the left knee.  No specific injury.  She has had a couple of hyaluronic acid injections.  She continues to have pain in the left knee.  She notes more pain radiating proximal and distal from the left knee.  Pain is primarily medial.  She is taking NSAIDs, as well as tramadol .  Review of Systems: No fevers or chills No numbness or tingling No chest pain No shortness of breath No bowel or bladder dysfunction No GI distress No headaches     Objective: LMP 08/12/2010   Physical Exam:  General: Alert and oriented. and No acute distress. Gait: Left sided antalgic gait.  Left knee with mild varus alignment.  Tenderness palpation along the medial joint line.  No increased laxity to varus or valgus stress.  Range of motion from 0-110 degrees.  She does have some pain with full extension.  Negative Lachman.  Tenderness along the medial tibial plateau, as well as the medial femoral condyle.  IMAGING: I personally ordered and reviewed the following images  Left knee MRI  IMPRESSION: Mild to moderate tricompartmental osteoarthrosis with chondromalacia and marginal osteophytes. There is a moderate reactive joint effusion.   Horizontal tear at the junction the body and posterior horn the medial meniscus without evidence of a displaced meniscal flap.   Ligaments are unremarkable.  New Medications:  No orders of the defined types were placed in this encounter.     Julie DELENA Horde, MD  03/07/2024 11:46 AM

## 2024-03-07 NOTE — Patient Instructions (Signed)
Instructions Following Joint Injections  In clinic today, you received an injection in one of your joints (sometimes more than one).  Occasionally, you can have some pain at the injection site, this is normal.  You can place ice at the injection site, or take over-the-counter medications such as Tylenol (acetaminophen) or Advil (ibuprofen).  Please follow all directions listed on the bottle.  If your joint (knee or shoulder) becomes swollen, red or very painful, please contact the clinic for additional assistance.   Injections in the same joint cannot be repeated for 3 months.  This helps to limit the risk of an infection in the joint.  If you were to develop an infection in your joint, the best treatment option would be surgery.

## 2024-03-08 ENCOUNTER — Other Ambulatory Visit: Payer: Self-pay | Admitting: Orthopedic Surgery

## 2024-03-29 ENCOUNTER — Ambulatory Visit: Payer: Self-pay

## 2024-03-29 NOTE — Telephone Encounter (Signed)
 FYI Only or Action Required?: FYI only for provider: UC advised .  Patient was last seen in primary care on 02/02/2024 by Micheal Wolm ORN, MD.  Called Nurse Triage reporting Knee Pain and Leg Pain.  Symptoms began several weeks ago.  Interventions attempted: OTC medications: otc pain medication not helping.  Symptoms are: gradually worsening.  Triage Disposition: See HCP Within 4 Hours (Or PCP Triage)  Patient/caregiver understands and will follow disposition?: Yes             Reason for Disposition  [1] SEVERE pain (e.g., excruciating, unable to do any normal activities) AND [2] not improved after 2 hours of pain medicine  Answer Assessment - Initial Assessment Questions Patient reports pain in left leg ,when walks sometimes knee cap will pop  sometimes. Pain back of thigh , back of knee . Side of knee. Sometimes goes down leg. Does have arthritis in it. Sometimes big toe goes over other toe and gets stuck. Walks with walker. Uses walker to get off couch. Bed high mattress . The leg does swell up yesterday, has gone down some. Has had XR before was told was not bone to bone had injection and seemed like hurting the worst. Pain is 10/10 . One of veins  back of leg is puffy. Patient  advised UC no appointments today in office . Patient is agreeable and going to [facility] [place]    1. ONSET: When did the pain start?      Ongoing but worse since injection in November  2. LOCATION: Where is the pain located?      Pain is left  leg , knee  3. PAIN: How bad is the pain?    (Scale 1-10; or mild, moderate, severe)     10/10 severe pain medication doesn't even touch it last night  5. CAUSE: What do you think is causing the leg pain?     Reports having arthritis  6. OTHER SYMPTOMS: Do you have any other symptoms? (e.g., chest pain, back pain, breathing difficulty, swelling, rash, fever, numbness, weakness)     Swelling of the leg very swollen  yesterday and swelling went down some. Thought the gel injection was going to help but hasn't the pain is the thigh left  side and goes down the leg behind the knee . Not able to bend knee at all   Patient denies chest pain, shortness of breath, fever  Protocols used: Leg Pain-A-AH Copied from CRM #[id number]. Topic: Clinical - Red Word Triage >> Mar 29, 2024  9:44 AM Alfonso ORN wrote: Red Word that prompted transfer to Nurse Triage: pt called regarding knee and leg pain, injections from 3 weeks ago not working, worsening pain unable to walk

## 2024-04-03 NOTE — Telephone Encounter (Signed)
 Chronic problem. She sees rheumatologist and orthopedist. BJ

## 2024-04-07 ENCOUNTER — Other Ambulatory Visit: Payer: Self-pay | Admitting: Family Medicine

## 2024-04-07 DIAGNOSIS — E039 Hypothyroidism, unspecified: Secondary | ICD-10-CM

## 2024-04-11 ENCOUNTER — Encounter (HOSPITAL_COMMUNITY): Payer: Self-pay | Admitting: Emergency Medicine

## 2024-04-11 ENCOUNTER — Other Ambulatory Visit: Payer: Self-pay

## 2024-04-11 ENCOUNTER — Emergency Department (HOSPITAL_COMMUNITY)
Admission: EM | Admit: 2024-04-11 | Discharge: 2024-04-11 | Disposition: A | Discharge status: Home / Self Care | Payer: Medicare (Managed Care) | Attending: Emergency Medicine | Admitting: Emergency Medicine

## 2024-04-11 ENCOUNTER — Emergency Department (HOSPITAL_COMMUNITY): Payer: Medicare (Managed Care)

## 2024-04-11 DIAGNOSIS — M25562 Pain in left knee: Secondary | ICD-10-CM | POA: Diagnosis present

## 2024-04-11 DIAGNOSIS — G8929 Other chronic pain: Secondary | ICD-10-CM | POA: Insufficient documentation

## 2024-04-11 MED ORDER — DEXAMETHASONE SOD PHOSPHATE PF 10 MG/ML IJ SOLN
10.0000 mg | Freq: Once | INTRAMUSCULAR | Status: AC
  Administered 2024-04-11: 10 mg via INTRAMUSCULAR
  Filled 2024-04-11: qty 1

## 2024-04-11 MED ORDER — KETOROLAC TROMETHAMINE 60 MG/2ML IM SOLN
30.0000 mg | Freq: Once | INTRAMUSCULAR | Status: AC
  Administered 2024-04-11: 30 mg via INTRAMUSCULAR
  Filled 2024-04-11: qty 2

## 2024-04-11 MED ORDER — OXYCODONE-ACETAMINOPHEN 5-325 MG PO TABS
1.0000 | ORAL_TABLET | Freq: Once | ORAL | Status: AC
  Administered 2024-04-11: 1 via ORAL
  Filled 2024-04-11: qty 1

## 2024-04-11 NOTE — ED Provider Notes (Signed)
 " [facility]ERGENCY DEPARTMENT AT [facility] Provider Note   CSN: [id number] Arrival date & time: 04/11/24  9192     Patient presents with: Knee Pain   Julie Perry is a 64 y.o. female.   HPI Patient presents for left knee pain.  Medical history includes GERD, osteoarthritis, rheumatoid arthritis, chronic back pain.  She is on Plaquenil for her rheumatoid arthritis.  She has been getting seen by Dr. Onesimo for left knee arthritis.  Her treatment has been Tylenol , NSAIDs, topical creams, physical therapy, as well as intra-articular steroid injections for the past 3 months.  With the first 2 shots, she did have some improvement to the following days.  She underwent her third injection a month ago.  This 1 did not seem to relieve her pain at all.  She has had ongoing pain which has not changed in character.  Pain is worsened with prolonged time on her feet.  She does continue to work part-time in a job where she does have to stand and walk.  Her current pain regimen is extra thing Tylenol , tramadol , diclofenac .  She has not taken any pain medicine today.    Prior to Admission medications  Medication Sig Start Date End Date Taking? Authorizing Provider  diclofenac  (VOLTAREN ) 75 MG EC tablet Take 1 tablet by mouth twice daily as needed 08/31/23   Jordan, Betty G, MD  doxycycline  (VIBRAMYCIN ) 100 MG capsule Take 1 capsule (100 mg total) by mouth 2 (two) times daily. 02/02/24   Burchette, Wolm ORN, MD  gabapentin  (NEURONTIN ) 100 MG capsule Take 1-3 capsules (100-300 mg total) by mouth at bedtime. 07/05/23   Corey, Evan S, MD  hydroxychloroquine (PLAQUENIL) 200 MG tablet Take 200 mg by mouth 2 (two) times daily. 12/07/23   [provider]  levothyroxine  (SYNTHROID ) 100 MCG tablet Take 1 tablet by mouth once daily 04/07/24   Jordan, Betty G, MD  mirabegron  ER (MYRBETRIQ ) 25 MG TB24 tablet Take 1 tablet by mouth once daily 11/30/23   Jordan, Betty G, MD  predniSONE  (STERAPRED UNI-PAK 48 TAB)  10 MG (48) TBPK tablet TAKE BY MOUTH AS DIRECTED ON INSIDE OF PACKAGE 03/08/24   Onesimo Oneil LABOR, MD  traMADol  (ULTRAM ) 50 MG tablet TAKE 2 TABLETS BY MOUTH ONCE DAILY AS NEEDED 11/16/23   Jordan, Betty G, MD  Zoster Vaccine Adjuvanted (SHINGRIX ) injection Inject into the muscle. 09/28/23   Luiz Channel, MD    Allergies: Patient has no known allergies.    Review of Systems  Musculoskeletal:  Positive for arthralgias.  All other systems reviewed and are negative.   Updated Vital Signs BP 127/71   Pulse (!) 58   Temp 98.7 F (37.1 C) (Oral)   Resp 16   Ht 5' 2 (1.575 m)   Wt 90.7 kg   LMP 08/12/2010   SpO2 97%   BMI 36.58 kg/m   Physical Exam Vitals and nursing note reviewed.  Constitutional:      General: She is not in acute distress.    Appearance: Normal appearance. She is well-developed. She is not ill-appearing, toxic-appearing or diaphoretic.  HENT:     Head: Normocephalic and atraumatic.     Right Ear: External ear normal.     Left Ear: External ear normal.     Nose: Nose normal.     Mouth/Throat:     Mouth: Mucous membranes are moist.  Eyes:     Extraocular Movements: Extraocular movements intact.     Conjunctiva/sclera: Conjunctivae  normal.  Cardiovascular:     Rate and Rhythm: Normal rate and regular rhythm.  Pulmonary:     Effort: Pulmonary effort is normal. No respiratory distress.  Abdominal:     General: There is no distension.     Palpations: Abdomen is soft.     Tenderness: There is no abdominal tenderness.  Musculoskeletal:        General: No swelling or deformity.     Cervical back: Normal range of motion and neck supple.  Skin:    General: Skin is warm and dry.     Coloration: Skin is not jaundiced or pale.  Neurological:     General: No focal deficit present.     Mental Status: She is alert and oriented to person, place, and time.  Psychiatric:        Mood and Affect: Mood normal.        Behavior: Behavior normal.     (all labs ordered  are listed, but only abnormal results are displayed) Labs Reviewed - No data to display  EKG: None  Radiology: DG Knee Complete 4 Views Left Result Date: 04/11/2024 CLINICAL DATA:  Left knee pain for several months EXAM: LEFT KNEE - COMPLETE 4+ VIEW COMPARISON:  None Available. FINDINGS: No evidence of fracture, dislocation, or joint effusion. Severe narrowing of medial joint space is noted. Soft tissues are unremarkable. IMPRESSION: Severe degenerative joint disease is noted medially. No acute abnormality seen. Electronically Signed   By: Lynwood Landy Raddle M.D.   On: 04/11/2024 08:57     Procedures   Medications Ordered in the ED  ketorolac  (TORADOL ) injection 30 mg (30 mg Intramuscular Given 04/11/24 0855)  dexamethasone  (DECADRON ) injection 10 mg (10 mg Intramuscular Given 04/11/24 0855)  oxyCODONE -acetaminophen  (PERCOCET/ROXICET) 5-325 MG per tablet 1 tablet (1 tablet Oral Given 04/11/24 0855)                                    Medical Decision Making Amount and/or Complexity of Data Reviewed Radiology: ordered.  Risk Prescription drug management.   Patient presenting for ongoing left knee pain.  This has been present for the past several months and she has been following up with Dr. Onesimo in the orthopedic surgery office.  She denies any new injuries.  She denies any evolution of the pain.  She presents due to frustration with ongoing pain over the past month.  Most recent intra-articular injection was not helpful.  She has not had any escalations in her home pain therapy.  On exam, she is well-appearing.  Her left knee is not swollen, erythematous, or warm.  She has very mild tenderness.  She does request an x-ray which was ordered.  Toradol , Percocet, and Decadron  were ordered for symptomatic relief.  X-ray showed no acute findings with redemonstration of known severe degenerative joint disease medially.  Patient did have improved symptoms while in the ED.  She was provided with Ace  wrap.  Patient to continue follow-up with orthopedic surgery.  She was discharged in good condition.     Final diagnoses:  Chronic pain of left knee    ED Discharge Orders     None          Melvenia Motto, MD 04/11/24 [phone]  "

## 2024-04-11 NOTE — ED Triage Notes (Signed)
 Pt c/o of left knee pain x a few months. Pt has been seen by pcp for the pain and has had no relief w/ injections. Denies any new injuries.

## 2024-04-11 NOTE — Discharge Instructions (Signed)
 Continue home pain medication.  Utilize Ace wrap for stabilization and comfort.  Follow-up with Dr. Onesimo.

## 2024-04-14 ENCOUNTER — Encounter: Payer: Self-pay | Admitting: Orthopedic Surgery

## 2024-04-14 ENCOUNTER — Ambulatory Visit: Payer: Medicare (Managed Care) | Admitting: Orthopedic Surgery

## 2024-04-14 DIAGNOSIS — M1712 Unilateral primary osteoarthritis, left knee: Secondary | ICD-10-CM | POA: Diagnosis not present

## 2024-04-14 MED ORDER — PREDNISONE 10 MG (21) PO TBPK: ORAL_TABLET | ORAL | 0 refills | Status: AC

## 2024-04-14 NOTE — Progress Notes (Signed)
 Return Patient Visit  Assessment: Julie Perry is a 64 y.o. female with the following: Left knee arthritis; also being treated for rheumatoid arthritis  Horizontal tear of the medial meniscus, without displaceable flap   Plan: Julie Perry continues to have pain in the left knee.  Recent x-rays obtained in the emergency department demonstrates bone-on-bone articulation within the medial compartment.  There is subchondral sclerosis, and large osteophytes.  This represents the primary area of her discomfort.  She also has degenerative changes within the patellofemoral joint, as was demonstrated on her recent MRI.  We have tried multiple steroid injections, and a round of hyaluronic acid injections, without sustained improvement.  I do think it is reasonable for her to discuss total knee arthroplasty with Dr. Margrette.  We will have her schedule an appointment.  Of note, most recent injection was December 9, and she is aware that she will have to wait 3 months before proceeding with surgery on the left knee.  I have provided her with a prescription for a prednisone  Dosepak, to help with the acute onset of pain.  All questions have been answered.  She is in agreement this plan.    Follow-up: Return for Schedule an appointment with Dr. Margrette for the left knee.  Subjective:  Chief Complaint  Patient presents with   Leg Pain    L below the knee pt states pain is constant now since the last HA injection. Pt states she did have to go to the ER and was given pain medication but it only helped briefly.     History of Present Illness: Julie Perry is a 64 y.o. female who returns for evaluation of left knee pain.  I have seen her several times in clinic.  We have completed steroid injections, and more recently completed HA injections.  She continues to have a lot of pain discomfort.  Pain got severe enough earlier this week, and that she was evaluated in the emergency department.  She was given IM  injections of steroid, as well as Toradol  in the emergency department, notes improved symptoms for about a day.  She takes chronic tramadol .  She was also given some oxycodone  in the emergency department.   Review of Systems: No fevers or chills No numbness or tingling No chest pain No shortness of breath No bowel or bladder dysfunction No GI distress No headaches     Objective: LMP 08/12/2010   Physical Exam:  General: Alert and oriented. and No acute distress. Gait: Left sided antalgic gait.  Left knee with mild varus alignment.  Tenderness palpation along the medial joint line.  No increased laxity to varus or valgus stress.  Range of motion from 0-110 degrees.  She does have some pain with full extension.  Negative Lachman.  Tenderness along the medial tibial plateau, as well as the medial femoral condyle.  IMAGING: I personally ordered and reviewed the following images  Left knee MRI  IMPRESSION: Mild to moderate tricompartmental osteoarthrosis with chondromalacia and marginal osteophytes. There is a moderate reactive joint effusion.   Horizontal tear at the junction the body and posterior horn the medial meniscus without evidence of a displaced meniscal flap.   Ligaments are unremarkable.   New Medications:  Meds ordered this encounter  Medications   predniSONE  (STERAPRED UNI-PAK 21 TAB) 10 MG (21) TBPK tablet    Sig: 10 mg DS 12 as directed    Dispense:  48 tablet    Refill:  0  Oneil DELENA Horde, MD  04/14/2024 12:46 PM

## 2024-04-19 ENCOUNTER — Ambulatory Visit: Payer: Medicare (Managed Care) | Admitting: Orthopedic Surgery

## 2024-04-21 ENCOUNTER — Ambulatory Visit: Payer: Medicare (Managed Care) | Admitting: Orthopedic Surgery

## 2024-04-21 ENCOUNTER — Encounter: Payer: Self-pay | Admitting: Orthopedic Surgery

## 2024-04-21 VITALS — Ht 62.0 in | Wt 200.0 lb

## 2024-04-21 DIAGNOSIS — M1712 Unilateral primary osteoarthritis, left knee: Secondary | ICD-10-CM | POA: Diagnosis not present

## 2024-04-21 NOTE — Progress Notes (Signed)
" ° ° °  04/21/2024   Chief Complaint  Patient presents with   Knee Pain    Left/ per Dr Onesimo surgical consult     No diagnosis found.  What pharmacy do you use ? ______[facility]_____________________  DOI/DOS/ Date:    Did you get better, worse or no change (Answer below)   Worse    BMI 36  "

## 2024-04-21 NOTE — Patient Instructions (Signed)
 Your surgery will be at [facility] by Dr Phyllis Breeze  plan to be in hospital overnight. The hospital will contact you with a preoperative appointment to discuss Anesthesia.  Please arrive on time or 15 minutes early for the preoperative appointment, they have a very tight schedule if you are late or do not come in your surgery will be cancelled.  The phone number for the preop area is [phone]. Please bring your medications with you for the appointment. They will tell you the arrival time for surgery and medication instructions when you have your preoperative evaluation. Do not wear nail polish the day of your surgery and if you take Phentermine you need to stop this medication ONE WEEK prior to your surgery. f you take Invokana, Farxiga, Jardiance, or Steglatro) - Hold 72 hours before the procedure.  If you take Ozempic,  Bydureo, Mounjaro, [place] or Trulicity do not take for 8 days before your surgery. If you take Victoza, Rybelsis, Saxenda or Adlyxi stop 24 hours before the procedure. Please arrive at the hospital 2 hours before procedure if scheduled at 9:30 or later in the day or at the time the nurse tells you at your preoperative visit.   If you have my chart do not use the time given in my chart use the time given to you by the nurse during your preoperative visit.   Your surgery  time may change. Please be available for phone calls the day of your surgery and the day before. The Short Stay department may need to discuss changes about your surgery time. Not reaching the you could lead to procedure delays and possible cancellation.  You must have a ride home and someone to stay with you for 24 to 48 hours. The person taking you home will receive and sign for the your discharge instructions.  Please be prepared to give your support person's name and telephone number to [employer]. Dr Phyllis Breeze will need that name and phone number post procedure.   You will also get a call from a  representative of [employer], they have a machine that you will use in the first few weeks after surgery. It is called a CPM.   You will have home physical therapy for 2 weeks after surgery, the home health agency will call you before or just following the surgery to set up visits. [facility] is the agency we normally use, unless you request another agency.   You will get a call also from outpatient therapy for therapy starting when the home therapy is done.  If you have questions or need to Reschedule the surgery, call the office ask for Mariona Scholes.

## 2024-04-21 NOTE — Progress Notes (Signed)
 "  Office Visit Note   Patient: Julie Perry           Date of Birth: 1960/04/14           MRN: [id number] Visit Date: 04/21/2024 Requested by: Jordan, Betty G, MD [address] [place],  [place] [address] PCP: Jordan, Betty G, MD   Assessment & Plan:   Encounter Diagnosis  Name Primary?   Arthritis of left knee Yes    No orders of the defined types were placed in this encounter.   64 year old female with osteoarthritis of her left knee.  She wishes to proceed with left total knee arthroplasty after unsuccessful attempts at controlling her pain with nonoperative measures  She has increased risk of complications due to the varicose veins which may increase her risk of DVT and PE and has increased risk of peripheral edema from the same varicose vein issue.  There may be some issues with chronic pain control which could cause her difficulty as well  The patient will arrange at her convenience left total knee arthroplasty   Subjective: Chief Complaint  Patient presents with   Knee Pain    Left/ per Dr Onesimo surgical consult     HPI: This is a 64 year old female who is on hydroxychloroquine for arthritis in her hands she presents after evaluation and treatment by Dr. Dario for osteoarthritis and torn meniscus of her left knee.  She has had an adequate trial of nonoperative therapy his notes are referred to as a reference  At this time she is complaining of left knee pain over the medial joint and medial tibia with difficulty ambulating walking getting up from a chair.  She does not want to continue with attempts at weight loss physical therapy and would like to proceed with knee surgery after given the pluses and minuses of surgical intervention.                ROS: History of back pain currently not active takes some tramadol  for that  Rheumatoid arthritis as stated  No chest pain shortness of breath bowel or bladder dysfunction at this time   Images personally read  and my interpretation : Previous images show varus alignment to the left knee medial compartment narrowing which is severe secondary bone changes include osteophytes around the joint.  All joints are involved.  Visit Diagnoses:  1. Arthritis of left knee      Follow-Up Instructions: Return in about 3 months (around 07/19/2024) for POST OP, LEFT, TKA.    Objective: Vital Signs: Ht 5' 2 (1.575 m)   Wt 200 lb (90.7 kg)   LMP 08/12/2010   BMI 36.58 kg/m   Physical Exam Vitals and nursing note reviewed.  Constitutional:      Appearance: Normal appearance.  HENT:     Head: Normocephalic and atraumatic.  Eyes:     General: No scleral icterus.       Right eye: No discharge.        Left eye: No discharge.     Extraocular Movements: Extraocular movements intact.     Conjunctiva/sclera: Conjunctivae normal.     Pupils: Pupils are equal, round, and reactive to light.  Cardiovascular:     Rate and Rhythm: Normal rate.     Pulses: Normal pulses.  Musculoskeletal:     Left knee: No effusion.  Skin:    General: Skin is warm and dry.     Capillary Refill: Capillary refill takes less than 2 seconds.  Neurological:     General: No focal deficit present.     Mental Status: She is alert and oriented to person, place, and time.  Psychiatric:        Mood and Affect: Mood normal.        Behavior: Behavior normal.        Thought Content: Thought content normal.        Judgment: Judgment normal.      Left Knee Exam   Muscle Strength  The patient has normal left knee strength.  Tenderness  The patient is experiencing tenderness in the medial joint line.  Range of Motion  Extension:  abnormal  Left knee flexion: 105.   Tests  Drawer:  Anterior - negative     Posterior - negative  Other  Erythema: absent Scars: absent Sensation: normal Pulse: present Swelling: mild Effusion: no effusion present  Comments:  Both lower extremities exhibit peripheral edema and spider and  varicose veins       Specialty Comments:  No specialty comments available.  Imaging: No results found.   PMFS History: Patient Active Problem List   Diagnosis Date Noted   Rheumatoid arthritis involving multiple sites with positive rheumatoid factor (HCC) 10/05/2023   Long-term use of Plaquenil 10/05/2023   Positive anti-CCP test 10/05/2023   Rheumatoid arthritis (HCC) 08/04/2023   Routine general medical examination at a health care facility 06/30/2023   Urine frequency 11/03/2022   Chronic bilateral low back pain with bilateral sciatica 09/26/2021   Constipation 08/21/2019   Chronic pain disorder 04/20/2018   Insomnia 04/20/2018   Generalized osteoarthritis of multiple sites 01/14/2017   Osteoarthritis of both knees 01/14/2017   Osteoarthritis of facet joint of lumbar spine 03/16/2016   Morbid obesity (HCC) with BMI 36 and knee OA,RA,GERD 06/22/2013   Fatigue 01/22/2009   Hypothyroidism 01/20/2007   GERD 01/20/2007   Past Medical History:  Diagnosis Date   GERD (gastroesophageal reflux disease)    RA (rheumatoid arthritis) (HCC)    Thyroid  disease     Family History  Problem Relation Age of Onset   Diabetes Mother    Breast cancer Maternal Aunt    Breast cancer Maternal Aunt    Collagen disease Maternal Grandmother    Collagen disease Other    Broken bones Neg Hx     History reviewed. No pertinent surgical history. Social History   Occupational History   Not on file  Tobacco Use   Smoking status: Former    Types: Cigarettes    Passive exposure: Past   Smokeless tobacco: Never  Vaping Use   Vaping status: Never Used  Substance and Sexual Activity   Alcohol use: Yes    Comment: Rarely once a year   Drug use: No   Sexual activity: Not on file       "

## 2024-05-10 ENCOUNTER — Ambulatory Visit: Payer: Medicare (Managed Care) | Admitting: Family Medicine

## 2024-07-19 ENCOUNTER — Encounter: Payer: Medicare (Managed Care) | Admitting: Orthopedic Surgery
